# Patient Record
Sex: Female | Born: 1940 | Race: White | Hispanic: No | Smoking: Never smoker
Health system: Southern US, Community
[De-identification: ages and names within clinical notes are randomized; demographics above are authoritative.]

## PROBLEM LIST (undated history)

## (undated) DIAGNOSIS — M810 Age-related osteoporosis without current pathological fracture: Secondary | ICD-10-CM

## (undated) DIAGNOSIS — M069 Rheumatoid arthritis, unspecified: Secondary | ICD-10-CM

## (undated) DIAGNOSIS — F419 Anxiety disorder, unspecified: Secondary | ICD-10-CM

## (undated) DIAGNOSIS — E785 Hyperlipidemia, unspecified: Secondary | ICD-10-CM

## (undated) HISTORY — DX: Hyperlipidemia, unspecified: E78.5

## (undated) HISTORY — DX: Age-related osteoporosis without current pathological fracture: M81.0

## (undated) HISTORY — DX: Rheumatoid arthritis, unspecified: M06.9

## (undated) HISTORY — PX: CHOLECYSTECTOMY: SHX55

## (undated) HISTORY — DX: Anxiety disorder, unspecified: F41.9

---

## 1998-02-03 ENCOUNTER — Other Ambulatory Visit: Admission: RE | Admit: 1998-02-03 | Discharge: 1998-02-03 | Payer: Self-pay | Admitting: Family Medicine

## 2000-07-16 ENCOUNTER — Ambulatory Visit (HOSPITAL_COMMUNITY): Admission: RE | Admit: 2000-07-16 | Discharge: 2000-07-16 | Payer: Self-pay | Admitting: Family Medicine

## 2000-07-16 ENCOUNTER — Encounter: Payer: Self-pay | Admitting: Family Medicine

## 2001-02-17 ENCOUNTER — Other Ambulatory Visit: Admission: RE | Admit: 2001-02-17 | Discharge: 2001-02-17 | Payer: Self-pay | Admitting: General Surgery

## 2001-03-05 ENCOUNTER — Ambulatory Visit (HOSPITAL_COMMUNITY): Admission: RE | Admit: 2001-03-05 | Discharge: 2001-03-05 | Payer: Self-pay | Admitting: General Surgery

## 2001-07-21 ENCOUNTER — Ambulatory Visit (HOSPITAL_COMMUNITY): Admission: RE | Admit: 2001-07-21 | Discharge: 2001-07-21 | Payer: Self-pay | Admitting: General Surgery

## 2001-07-21 ENCOUNTER — Encounter: Payer: Self-pay | Admitting: General Surgery

## 2002-08-26 ENCOUNTER — Encounter: Payer: Self-pay | Admitting: General Surgery

## 2002-08-26 ENCOUNTER — Ambulatory Visit (HOSPITAL_COMMUNITY): Admission: RE | Admit: 2002-08-26 | Discharge: 2002-08-26 | Payer: Self-pay | Admitting: General Surgery

## 2003-08-30 ENCOUNTER — Ambulatory Visit (HOSPITAL_COMMUNITY): Admission: RE | Admit: 2003-08-30 | Discharge: 2003-08-30 | Payer: Self-pay | Admitting: General Surgery

## 2004-02-28 ENCOUNTER — Ambulatory Visit (HOSPITAL_COMMUNITY): Admission: RE | Admit: 2004-02-28 | Discharge: 2004-02-28 | Payer: Self-pay | Admitting: General Surgery

## 2004-09-06 ENCOUNTER — Ambulatory Visit (HOSPITAL_COMMUNITY): Admission: RE | Admit: 2004-09-06 | Discharge: 2004-09-06 | Payer: Self-pay | Admitting: General Surgery

## 2005-09-24 ENCOUNTER — Ambulatory Visit (HOSPITAL_COMMUNITY): Admission: RE | Admit: 2005-09-24 | Discharge: 2005-09-24 | Payer: Self-pay | Admitting: General Surgery

## 2005-10-05 ENCOUNTER — Encounter: Admission: RE | Admit: 2005-10-05 | Discharge: 2005-10-05 | Payer: Self-pay | Admitting: General Surgery

## 2006-06-10 ENCOUNTER — Ambulatory Visit (HOSPITAL_COMMUNITY): Admission: RE | Admit: 2006-06-10 | Discharge: 2006-06-10 | Payer: Self-pay | Admitting: Family Medicine

## 2006-10-08 ENCOUNTER — Ambulatory Visit (HOSPITAL_COMMUNITY): Admission: RE | Admit: 2006-10-08 | Discharge: 2006-10-08 | Payer: Self-pay | Admitting: Family Medicine

## 2007-12-08 ENCOUNTER — Ambulatory Visit (HOSPITAL_COMMUNITY): Admission: RE | Admit: 2007-12-08 | Discharge: 2007-12-08 | Payer: Self-pay | Admitting: Family Medicine

## 2008-12-08 ENCOUNTER — Ambulatory Visit (HOSPITAL_COMMUNITY): Admission: RE | Admit: 2008-12-08 | Discharge: 2008-12-08 | Payer: Self-pay | Admitting: Obstetrics and Gynecology

## 2009-07-21 ENCOUNTER — Ambulatory Visit: Payer: Self-pay | Admitting: Gastroenterology

## 2009-12-30 ENCOUNTER — Ambulatory Visit (HOSPITAL_COMMUNITY): Admission: RE | Admit: 2009-12-30 | Discharge: 2009-12-30 | Payer: Self-pay | Admitting: Obstetrics and Gynecology

## 2010-03-12 ENCOUNTER — Encounter: Payer: Self-pay | Admitting: Family Medicine

## 2010-07-07 NOTE — H&P (Signed)
[facility]  Patient:    Denise Webster, Denise Webster Visit Number: [id number] MRN: [id number]          Service Type: END Location: DAY Attending Physician:  Dessa Phi Dictated by:   Elpidio Anis, M.D. Admit Date:  03/05/2001                           History and Physical  HISTORY OF PRESENT ILLNESS:  A 70 year old female with history of colon cancer in her mother and father with recurrent polyps in both parents.  Patient is scheduled for screening colonoscopy.  PAST MEDICAL HISTORY:  She has gastroesophageal reflux disease, chronic anxiety disorder, and postmenopausal syndrome.  MEDICATIONS: 1. Premarin vaginal cream q.h.s. 2. Xanax 0.5 mg t.i.d. 3. Prevacid 30 mg q.d. 4. Reglan 10 mg a.c. and h.s.  PAST SURGICAL HISTORY:  Laparoscopic cholecystectomy for acute cholecystitis with cholelithiasis.  Other surgery was tubal ligation.  DRUG ALLERGIES:  None.  FAMILY HISTORY:  Positive for atherosclerotic heart disease, colon cancer in both parents, polypoid disease of the colon.  REVIEW OF SYSTEMS:  Positive for perianal itching, mild hemorrhoids, chronic anxiety with panic attacks, mild depression, gastroesophageal reflux disease. She has no known drug allergies.  PHYSICAL EXAMINATION:  GENERAL:  A pleasant female in no acute distress.  VITAL SIGNS:  Blood pressure 118/72, pulse 72, and respirations 16.  Weight 128 pounds.  HEENT:  Unremarkable.  NECK:  Supple without JVD or bruit.  CHEST:  Clear to auscultation.  No rales, rubs, rhonchi, or wheezes.  HEART:  Regular rate and rhythm without murmur, gallop, or rub.  ABDOMEN:  Soft, nontender, no masses.  RECTAL:  Normal.  Stool guaiac negative.  EXTREMITIES:  No cyanosis, clubbing, or edema.  NEUROLOGIC:  No focal motor, sensory, or cerebellar deficits.  IMPRESSION: 1. Strong family history of colon cancer. 2. Polypoid disease of the colon by history. 3. Anxiety disorder. 4.  Gastroesophageal reflux disease.  PLAN:  Total colonoscopy. Dictated by:   Elpidio Anis, M.D. Attending Physician:  Dessa Phi DD:  03/04/01 TD:  03/04/01 Job: [id number]

## 2010-11-29 ENCOUNTER — Other Ambulatory Visit: Payer: Self-pay | Admitting: Family Medicine

## 2010-11-29 DIAGNOSIS — Z139 Encounter for screening, unspecified: Secondary | ICD-10-CM

## 2011-01-05 ENCOUNTER — Ambulatory Visit (HOSPITAL_COMMUNITY): Payer: Self-pay

## 2011-01-05 ENCOUNTER — Ambulatory Visit (HOSPITAL_COMMUNITY)
Admission: RE | Admit: 2011-01-05 | Discharge: 2011-01-05 | Disposition: A | Discharge status: Home / Self Care | Payer: Medicare PPO | Source: Ambulatory Visit | Attending: Family Medicine | Admitting: Family Medicine

## 2011-01-05 DIAGNOSIS — Z139 Encounter for screening, unspecified: Secondary | ICD-10-CM

## 2011-01-05 DIAGNOSIS — Z1231 Encounter for screening mammogram for malignant neoplasm of breast: Secondary | ICD-10-CM | POA: Insufficient documentation

## 2011-11-26 ENCOUNTER — Other Ambulatory Visit: Payer: Self-pay | Admitting: Nurse Practitioner

## 2011-11-26 DIAGNOSIS — Z139 Encounter for screening, unspecified: Secondary | ICD-10-CM

## 2012-01-11 ENCOUNTER — Ambulatory Visit (HOSPITAL_COMMUNITY): Payer: Medicare PPO

## 2012-01-14 ENCOUNTER — Ambulatory Visit (HOSPITAL_COMMUNITY)
Admission: RE | Admit: 2012-01-14 | Discharge: 2012-01-14 | Disposition: A | Discharge status: Home / Self Care | Payer: Medicare PPO | Source: Ambulatory Visit | Attending: Nurse Practitioner | Admitting: Nurse Practitioner

## 2012-01-14 DIAGNOSIS — Z139 Encounter for screening, unspecified: Secondary | ICD-10-CM

## 2012-01-14 DIAGNOSIS — Z1231 Encounter for screening mammogram for malignant neoplasm of breast: Secondary | ICD-10-CM | POA: Insufficient documentation

## 2012-05-21 ENCOUNTER — Other Ambulatory Visit: Payer: Self-pay | Admitting: Nurse Practitioner

## 2012-05-21 MED ORDER — ALPRAZOLAM 0.5 MG PO TBDP: 0.5000 mg | ORAL_TABLET | Freq: Three times a day (TID) | ORAL | Status: DC | PRN

## 2012-05-21 NOTE — Telephone Encounter (Signed)
PT AWARE RX CALLED INTO [facility]

## 2012-05-21 NOTE — Telephone Encounter (Signed)
Please call into [facility] if approved, pt unable to pick up. Last filled 04-23-12

## 2012-05-21 NOTE — Telephone Encounter (Signed)
Please phone in alprazolam rx

## 2012-06-19 ENCOUNTER — Other Ambulatory Visit: Payer: Self-pay | Admitting: Nurse Practitioner

## 2012-06-20 ENCOUNTER — Telehealth: Payer: Self-pay | Admitting: Nurse Practitioner

## 2012-06-20 ENCOUNTER — Other Ambulatory Visit: Payer: Self-pay | Admitting: *Deleted

## 2012-06-20 MED ORDER — ALPRAZOLAM 0.5 MG PO TABS: 0.5000 mg | ORAL_TABLET | Freq: Three times a day (TID) | ORAL | Status: DC | PRN

## 2012-06-20 NOTE — Telephone Encounter (Signed)
Last filled 06/20/12, last seen 12/25/11. Call into [facility]

## 2012-06-24 NOTE — Telephone Encounter (Signed)
Sent to pool B to call in

## 2012-06-24 NOTE — Telephone Encounter (Signed)
Called to [facility] 

## 2012-07-20 ENCOUNTER — Other Ambulatory Visit: Payer: Self-pay | Admitting: Nurse Practitioner

## 2012-07-21 ENCOUNTER — Other Ambulatory Visit: Payer: Self-pay

## 2012-07-21 MED ORDER — ALPRAZOLAM 0.5 MG PO TABS: 0.5000 mg | ORAL_TABLET | Freq: Three times a day (TID) | ORAL | Status: DC | PRN

## 2012-07-21 NOTE — Telephone Encounter (Signed)
Last seen 12/25/11   Last filled 06/20/12  If approved call in and have nurse notify patient

## 2012-07-21 NOTE — Telephone Encounter (Signed)
Callin RX for xanax  

## 2012-07-22 ENCOUNTER — Telehealth: Payer: Self-pay | Admitting: Nurse Practitioner

## 2012-07-22 NOTE — Telephone Encounter (Signed)
rx called [facility]

## 2012-07-22 NOTE — Telephone Encounter (Signed)
Per patient rx has been fixed already and daughter has picked them up. Will call back if has further needs

## 2012-08-26 ENCOUNTER — Other Ambulatory Visit: Payer: Self-pay | Admitting: Nurse Practitioner

## 2012-08-28 ENCOUNTER — Telehealth: Payer: Self-pay | Admitting: Nurse Practitioner

## 2012-08-29 ENCOUNTER — Other Ambulatory Visit: Payer: Self-pay

## 2012-08-29 MED ORDER — ALPRAZOLAM 0.5 MG PO TABS: 0.5000 mg | ORAL_TABLET | Freq: Three times a day (TID) | ORAL | Status: DC | PRN

## 2012-08-29 NOTE — Telephone Encounter (Signed)
Please call in xanax rx with 2 refills 

## 2012-08-29 NOTE — Telephone Encounter (Signed)
Last seen 12/25/11 MMM  Last filled 07/21/12    If approved phone in and have nurse notify patient

## 2012-08-29 NOTE — Telephone Encounter (Signed)
Already routed to [facility]mm

## 2012-08-29 NOTE — Telephone Encounter (Signed)
Called to [facility].

## 2012-09-29 ENCOUNTER — Other Ambulatory Visit: Payer: Self-pay | Admitting: Nurse Practitioner

## 2012-10-01 ENCOUNTER — Ambulatory Visit: Payer: Self-pay | Admitting: Nurse Practitioner

## 2012-10-02 NOTE — Telephone Encounter (Signed)
Last seen 12/25/11

## 2012-10-29 ENCOUNTER — Other Ambulatory Visit: Payer: Self-pay | Admitting: Nurse Practitioner

## 2012-11-05 ENCOUNTER — Ambulatory Visit (INDEPENDENT_AMBULATORY_CARE_PROVIDER_SITE_OTHER): Payer: Medicare PPO | Admitting: Nurse Practitioner

## 2012-11-05 ENCOUNTER — Encounter: Payer: Self-pay | Admitting: Nurse Practitioner

## 2012-11-05 VITALS — BP 118/73 | HR 80 | Temp 97.0°F | Ht 60.0 in | Wt 117.0 lb

## 2012-11-05 DIAGNOSIS — M8580 Other specified disorders of bone density and structure, unspecified site: Secondary | ICD-10-CM

## 2012-11-05 DIAGNOSIS — Z0000 Encounter for general adult medical examination without abnormal findings: Secondary | ICD-10-CM

## 2012-11-05 DIAGNOSIS — F411 Generalized anxiety disorder: Secondary | ICD-10-CM

## 2012-11-05 DIAGNOSIS — E785 Hyperlipidemia, unspecified: Secondary | ICD-10-CM | POA: Insufficient documentation

## 2012-11-05 DIAGNOSIS — M069 Rheumatoid arthritis, unspecified: Secondary | ICD-10-CM | POA: Insufficient documentation

## 2012-11-05 DIAGNOSIS — G5790 Unspecified mononeuropathy of unspecified lower limb: Secondary | ICD-10-CM

## 2012-11-05 MED ORDER — AMITRIPTYLINE HCL 25 MG PO TABS: 25.0000 mg | ORAL_TABLET | Freq: Every day | ORAL | Status: DC

## 2012-11-05 MED ORDER — CLOBETASOL PROPIONATE 0.05 % EX CREA: TOPICAL_CREAM | Freq: Two times a day (BID) | CUTANEOUS | Status: DC

## 2012-11-05 MED ORDER — ALPRAZOLAM 0.5 MG PO TABS
0.5000 mg | ORAL_TABLET | Freq: Three times a day (TID) | ORAL | Status: DC | PRN
Start: 2012-11-05 — End: 2013-02-27

## 2012-11-05 NOTE — Progress Notes (Signed)
Subjective:    Patient ID: Denise Webster, female    DOB: 04-03-1940, 72 y.o.   MRN: [id number]  HPI Patient here today for CPE- NO PAP- She is doing well-no complaints today Patient Active Problem List   Diagnosis Date Noted  . Rheumatoid arthritis 11/05/2012  . GAD (generalized anxiety disorder) 11/05/2012  . 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 Hyperlipidemia LDL goal < 100 11/05/2012   Outpatient Encounter Prescriptions as of 11/05/2012  Medication Sig Dispense Refill  . ALPRAZolam (XANAX) 0.5 MG tablet Take 1 tablet (0.5 mg total) by mouth 3 (three) times daily as needed for sleep.  90 tablet  2  . amitriptyline (ELAVIL) 25 MG tablet TAKE ONE TABLET BY MOUTH ONE  TIME DAILY  30 tablet  0  . calcium carbonate (OS-CAL) 600 MG TABS tablet Take 600 mg by mouth 2 (two) times daily with a meal.      . co-enzyme Q-10 30 MG capsule Take 30 mg by mouth 3 (three) times daily.      Marland Kitchen etanercept (ENBREL) 50 MG/ML injection Inject 50 mg into the skin once a week.      . fish oil-omega-3 fatty acids 1000 MG capsule Take 2 g by mouth daily.      . folic acid (FOLVITE) 1 MG tablet Take 1 mg by mouth 2 (two) times daily.      Marland Kitchen glucosamine-chondroitin 500-400 MG tablet Take 1 tablet by mouth 3 (three) times daily.      . hydroxychloroquine (PLAQUENIL) 200 MG tablet Take by mouth 2 (two) times daily.      Marland Kitchen leflunomide (ARAVA) 20 MG tablet Take 20 mg by mouth daily.      . methotrexate (RHEUMATREX) 2.5 MG tablet Take 2.5 mg by mouth once a week. Caution:Chemotherapy. Protect from light.      . predniSONE (DELTASONE) 5 MG tablet Take 5 mg by mouth daily.      Marland Kitchen sulfaSALAzine (AZULFIDINE) 500 MG tablet Take 500 mg by mouth 2 (two) times daily.      . traMADol (ULTRAM) 50 MG tablet Take 50 mg by mouth 3 (three) times daily as needed for pain.      . vitamin B-12 (CYANOCOBALAMIN) 1000 MCG tablet Take 1,000 mcg by mouth daily.      . [DISCONTINUED] amitriptyline (ELAVIL) 25 MG tablet TAKE ONE TABLET BY  MOUTH ONE  TIME DAILY  30 tablet  0   No facility-administered encounter medications on file as of 11/05/2012.      Review of Systems  All other systems reviewed and are negative.       Objective:   Physical Exam  Constitutional: She is oriented to person, place, and time. She appears well-developed and well-nourished.  HENT:  Nose: Nose normal.  Mouth/Throat: Oropharynx is clear and moist.  Eyes: EOM are normal.  Neck: Trachea normal, normal range of motion and full passive range of motion without pain. Neck supple. No JVD present. Carotid bruit is not present. No thyromegaly present.  Cardiovascular: Normal rate, regular rhythm, normal heart sounds and intact distal pulses.  Exam reveals no gallop and no friction rub.   No murmur heard. Pulmonary/Chest: Effort normal and breath sounds normal.  Abdominal: Soft. Bowel sounds are normal. She exhibits no distension and no mass. There is no tenderness.  Musculoskeletal: Normal range of motion.  Lymphadenopathy:    She has no cervical adenopathy.  Neurological: She is alert and oriented to person, place, and time. She has normal  reflexes.  Skin: Skin is warm and dry.  Psychiatric: She has a normal mood and affect. Her behavior is normal. Judgment and thought content normal.   BP 118/73  Pulse 80  Temp(Src) 97 F (36.1 C) (Oral)  Ht 5' (1.524 m)  Wt 117 lb (53.071 kg)  BMI 22.85 kg/m2        Assessment & Plan:   1. Annual physical exam   2. Rheumatoid arthritis   3. GAD (generalized anxiety disorder)   4. Neuropathy of perineum, unspecified laterality   5. Hyperlipidemia LDL goal < 100   6. Osteopenia    Orders Placed This Encounter  Procedures  . CMP14+EGFR  . Anemia Profile B     Medication List       This list is accurate as of: 11/05/12 12:11 PM.  Always use your most recent med list.               ALPRAZolam 0.5 MG tablet  Commonly known as:  XANAX  Take 1 tablet (0.5 mg total) by mouth 3 (three)  times daily as needed for sleep.     amitriptyline 25 MG tablet  Commonly known as:  ELAVIL  Take 1 tablet (25 mg total) by mouth at bedtime.     calcium carbonate 600 MG Tabs tablet  Commonly known as:  OS-CAL  Take 600 mg by mouth 2 (two) times daily with a meal.     clobetasol cream 0.05 %  Commonly known as:  TEMOVATE  Apply topically 2 (two) times daily.     co-enzyme Q-10 30 MG capsule  Take 30 mg by mouth 3 (three) times daily.     etanercept 50 MG/ML injection  Commonly known as:  ENBREL  Inject 50 mg into the skin once a week.     fish oil-omega-3 fatty acids 1000 MG capsule  Take 2 g by mouth daily.     folic acid 1 MG tablet  Commonly known as:  FOLVITE  Take 1 mg by mouth 2 (two) times daily.     glucosamine-chondroitin 500-400 MG tablet  Take 1 tablet by mouth 3 (three) times daily.     hydroxychloroquine 200 MG tablet  Commonly known as:  PLAQUENIL  Take by mouth 2 (two) times daily.     leflunomide 20 MG tablet  Commonly known as:  ARAVA  Take 20 mg by mouth daily.     methotrexate 2.5 MG tablet  Commonly known as:  RHEUMATREX  Take 2.5 mg by mouth once a week. Caution:Chemotherapy. Protect from light.     predniSONE 5 MG tablet  Commonly known as:  DELTASONE  Take 5 mg by mouth daily.     sulfaSALAzine 500 MG tablet  Commonly known as:  AZULFIDINE  Take 500 mg by mouth 2 (two) times daily.     traMADol 50 MG tablet  Commonly known as:  ULTRAM  Take 50 mg by mouth 3 (three) times daily as needed for pain.     vitamin B-12 1000 MCG tablet  Commonly known as:  CYANOCOBALAMIN  Take 1,000 mcg by mouth daily.       Patient refuses to have cholesterol checked Refuses dexa scan Health maintenance reviewed Labs pending Follow up in 6 months Keep follow up appointments with rheumatologist  Mary-Margaret Daphine Deutscher, FNP

## 2012-11-05 NOTE — Patient Instructions (Signed)

## 2012-11-06 ENCOUNTER — Other Ambulatory Visit: Payer: Self-pay | Admitting: Nurse Practitioner

## 2012-11-06 LAB — ANEMIA PROFILE B
Basos: 1 %
HCT: 39.8 % (ref 34.0–46.6)
Hemoglobin: 13.4 g/dL (ref 11.1–15.9)
Immature Grans (Abs): 0 10*3/uL (ref 0.0–0.1)
Iron: 89 ug/dL (ref 35–155)
Lymphocytes Absolute: 1 10*3/uL (ref 0.7–3.1)
MCH: 35.4 pg — ABNORMAL HIGH (ref 26.6–33.0)
Monocytes: 5 %
Neutrophils Relative %: 81 %
Platelets: 316 10*3/uL (ref 150–379)
RBC: 3.78 x10E6/uL (ref 3.77–5.28)
RDW: 14.2 % (ref 12.3–15.4)
Retic Ct Pct: 1 % (ref 0.6–2.6)
TIBC: 255 ug/dL (ref 250–450)
UIBC: 166 ug/dL (ref 150–375)

## 2012-11-06 LAB — CMP14+EGFR
AST: 22 IU/L (ref 0–40)
Albumin/Globulin Ratio: 2.1 (ref 1.1–2.5)
BUN: 10 mg/dL (ref 8–27)
Calcium: 10.1 mg/dL (ref 8.6–10.2)
Creatinine, Ser: 0.79 mg/dL (ref 0.57–1.00)
GFR calc non Af Amer: 74 mL/min/{1.73_m2} (ref >59)
Potassium: 4.7 mmol/L (ref 3.5–5.2)
Total Protein: 6.8 g/dL (ref 6.0–8.5)

## 2012-12-08 ENCOUNTER — Other Ambulatory Visit: Payer: Self-pay | Admitting: Nurse Practitioner

## 2012-12-08 DIAGNOSIS — Z139 Encounter for screening, unspecified: Secondary | ICD-10-CM

## 2012-12-28 ENCOUNTER — Other Ambulatory Visit: Payer: Self-pay | Admitting: Nurse Practitioner

## 2013-01-05 ENCOUNTER — Telehealth: Payer: Self-pay | Admitting: Nurse Practitioner

## 2013-01-05 MED ORDER — ACYCLOVIR 5 % EX OINT: 1.0000 "application " | TOPICAL_OINTMENT | CUTANEOUS | Status: DC

## 2013-01-05 NOTE — Telephone Encounter (Signed)
rx sent to pharmacy

## 2013-01-07 ENCOUNTER — Ambulatory Visit (INDEPENDENT_AMBULATORY_CARE_PROVIDER_SITE_OTHER): Payer: Medicare PPO | Admitting: General Practice

## 2013-01-07 DIAGNOSIS — A6000 Herpesviral infection of urogenital system, unspecified: Secondary | ICD-10-CM

## 2013-01-07 MED ORDER — ACYCLOVIR 5 % EX OINT: TOPICAL_OINTMENT | CUTANEOUS | Status: DC

## 2013-01-07 MED ORDER — VALACYCLOVIR HCL 500 MG PO TABS: 500.0000 mg | ORAL_TABLET | Freq: Two times a day (BID) | ORAL | Status: DC

## 2013-01-07 NOTE — Patient Instructions (Signed)
Herpes Simplex Herpes simplex is generally classified as Type 1 or Type 2. Type 1 is generally the type that is responsible for cold sores. Type 2 is generally associated with sexually transmitted diseases. We now know that most of the thoughts on these viruses are inaccurate. We find that HSV1 is also present genitally and HSV2 can be present orally, but this will vary in different locations of the world. Herpes simplex is usually detected by doing a culture. Blood tests are also available for this virus; however, the accuracy is often not as good.  PREPARATION FOR TEST No preparation or fasting is necessary. NORMAL FINDINGS  No virus present  No HSV antigens or antibodies present Ranges for normal findings may vary among different laboratories and hospitals. You should always check with your doctor after having lab work or other tests done to discuss the meaning of your test results and whether your values are considered within normal limits. MEANING OF TEST  Your caregiver will go over the test results with you and discuss the importance and meaning of your results, as well as treatment options and the need for additional tests if necessary. OBTAINING THE TEST RESULTS  It is your responsibility to obtain your test results. Ask the lab or department performing the test when and how you will get your results. Document Released: 03/10/2004 Document Revised: 04/30/2011 Document Reviewed: 01/17/2008 Exit[employer] Patient Information 2014 [employer].  

## 2013-01-08 ENCOUNTER — Telehealth: Payer: Self-pay | Admitting: General Practice

## 2013-01-08 NOTE — Progress Notes (Signed)
  Subjective:    Patient ID: Denise Webster, female    DOB: 14-May-1940, 72 y.o.   MRN: [id number]  HPI Patient presents today with complaints of genital herpes outbreak. Reports onset of this outbreak was yesterday on the left labia, which followed the tingling sensation in that same area. She reports having periodic outbreaks since she was 72 years old. Reports using zovirax ointment 5% which works best for her.     Review of Systems  Constitutional: Negative for fever and chills.  Respiratory: Negative for chest tightness and shortness of breath.   Cardiovascular: Negative for chest pain and palpitations.  Genitourinary: Negative for difficulty urinating.  Skin:       Left labial blisters  Neurological: Negative for dizziness, weakness and headaches.       Objective:   Physical Exam  Constitutional: She is oriented to person, place, and time. She appears well-developed and well-nourished.  Cardiovascular: Normal rate, regular rhythm and normal heart sounds.   Pulmonary/Chest: Effort normal and breath sounds normal. No respiratory distress. She exhibits no tenderness.  Neurological: She is alert and oriented to person, place, and time.  Skin: Skin is warm and dry.  Blisters to left labia majora  Psychiatric: She has a normal mood and affect.          Assessment & Plan:  1. Genital herpes simplex - acyclovir ointment (ZOVIRAX) 5 %; Apply to affected area 3-6 times daily for 7 days  Dispense: 30 g; Refill: 3 -discussed transmission -discussed proper perineal hygiene -RTO if symptoms worsen -Patient verbalized understanding -Coralie Keens, FNP-C

## 2013-01-08 NOTE — Telephone Encounter (Signed)
I spoke with [facility] representative and now awaiting authorization for medication. Could take up to 72 hours for approval.

## 2013-01-09 ENCOUNTER — Ambulatory Visit: Payer: Medicare PPO | Admitting: General Practice

## 2013-01-09 NOTE — Telephone Encounter (Signed)
Patient aware.

## 2013-01-12 ENCOUNTER — Telehealth: Payer: Self-pay | Admitting: General Practice

## 2013-01-20 ENCOUNTER — Ambulatory Visit (HOSPITAL_COMMUNITY)
Admission: RE | Admit: 2013-01-20 | Discharge: 2013-01-20 | Disposition: A | Discharge status: Home / Self Care | Payer: Medicare PPO | Source: Ambulatory Visit | Attending: Nurse Practitioner | Admitting: Nurse Practitioner

## 2013-01-20 DIAGNOSIS — Z1231 Encounter for screening mammogram for malignant neoplasm of breast: Secondary | ICD-10-CM | POA: Insufficient documentation

## 2013-01-20 DIAGNOSIS — Z139 Encounter for screening, unspecified: Secondary | ICD-10-CM

## 2013-01-26 ENCOUNTER — Other Ambulatory Visit: Payer: Self-pay | Admitting: Nurse Practitioner

## 2013-01-30 ENCOUNTER — Other Ambulatory Visit: Payer: Self-pay

## 2013-02-25 ENCOUNTER — Other Ambulatory Visit: Payer: Self-pay | Admitting: Nurse Practitioner

## 2013-02-27 ENCOUNTER — Other Ambulatory Visit: Payer: Self-pay | Admitting: *Deleted

## 2013-02-27 MED ORDER — ALPRAZOLAM 0.5 MG PO TABS: 0.5000 mg | ORAL_TABLET | Freq: Three times a day (TID) | ORAL | Status: DC | PRN

## 2013-02-27 NOTE — Telephone Encounter (Signed)
Patient last seen in office on 01-07-13 for an acute visit. Rx last filled on 01-26-13. Please advise. If approved please route to Pool B so nurse can phone in to [facility]

## 2013-02-27 NOTE — Telephone Encounter (Signed)
Please call in xanax rx 

## 2013-03-02 NOTE — Telephone Encounter (Signed)
Called in.

## 2013-03-18 ENCOUNTER — Other Ambulatory Visit: Payer: Self-pay | Admitting: Nurse Practitioner

## 2013-03-31 ENCOUNTER — Other Ambulatory Visit: Payer: Self-pay | Admitting: Nurse Practitioner

## 2013-04-01 ENCOUNTER — Other Ambulatory Visit: Payer: Self-pay | Admitting: *Deleted

## 2013-04-01 MED ORDER — ALPRAZOLAM 0.5 MG PO TABS: 0.5000 mg | ORAL_TABLET | Freq: Three times a day (TID) | ORAL | Status: DC | PRN

## 2013-04-01 NOTE — Telephone Encounter (Signed)
Please call in xanax with 1 refills 

## 2013-04-01 NOTE — Telephone Encounter (Signed)
Last filled 03/02/13, last seen 01/07/13, Call into [facility]

## 2013-04-02 NOTE — Telephone Encounter (Signed)
[facility]

## 2013-04-28 ENCOUNTER — Other Ambulatory Visit: Payer: Self-pay | Admitting: Nurse Practitioner

## 2013-05-30 ENCOUNTER — Other Ambulatory Visit: Payer: Self-pay | Admitting: Nurse Practitioner

## 2013-06-01 NOTE — Telephone Encounter (Signed)
Left refill authorization on [facility] voicemail. 

## 2013-06-01 NOTE — Telephone Encounter (Signed)
Please call in xanax with 0 refills Patient NTBS for follow up and lab work  

## 2013-06-01 NOTE — Telephone Encounter (Signed)
Patient last seen in office on 01-07-13. Rx last filled on 04-30-13. Please advise. If approved please route to Pool B so nurse can phone in to pharmacy

## 2013-07-03 ENCOUNTER — Other Ambulatory Visit: Payer: Self-pay | Admitting: Nurse Practitioner

## 2013-07-06 NOTE — Telephone Encounter (Signed)
Please call in xanax with 0 refills Patient NTBS for follow up and lab work  

## 2013-07-06 NOTE — Telephone Encounter (Signed)
Last seen 11/05/12, last filled 06/01/13, call into [facility]

## 2013-08-08 ENCOUNTER — Other Ambulatory Visit: Payer: Self-pay | Admitting: Nurse Practitioner

## 2013-08-11 NOTE — Telephone Encounter (Signed)
Patient NTBS for follow up and lab work Please call in xanax with 1 refills  

## 2013-08-11 NOTE — Telephone Encounter (Signed)
Patient last seen in office on 01-07-13 with Denise Webster. Xanax last filled on 07-06-13 for #90. Please advise on refill. If approved please route to Pool B so nurse can phone in to pharmacy

## 2013-08-11 NOTE — Telephone Encounter (Signed)
Called in.

## 2013-08-25 ENCOUNTER — Ambulatory Visit (INDEPENDENT_AMBULATORY_CARE_PROVIDER_SITE_OTHER): Payer: Medicare PPO | Admitting: Family

## 2013-08-25 ENCOUNTER — Encounter: Payer: Self-pay | Admitting: Family

## 2013-08-25 VITALS — BP 137/79 | HR 100 | Temp 98.5°F | Ht 60.0 in | Wt 118.0 lb

## 2013-08-25 DIAGNOSIS — Z124 Encounter for screening for malignant neoplasm of cervix: Secondary | ICD-10-CM

## 2013-08-25 DIAGNOSIS — Z01419 Encounter for gynecological examination (general) (routine) without abnormal findings: Secondary | ICD-10-CM

## 2013-08-25 DIAGNOSIS — Z780 Asymptomatic menopausal state: Secondary | ICD-10-CM

## 2013-08-25 DIAGNOSIS — F411 Generalized anxiety disorder: Secondary | ICD-10-CM

## 2013-08-25 DIAGNOSIS — Z1321 Encounter for screening for nutritional disorder: Secondary | ICD-10-CM

## 2013-08-25 DIAGNOSIS — M069 Rheumatoid arthritis, unspecified: Secondary | ICD-10-CM

## 2013-08-25 LAB — POCT URINALYSIS DIPSTICK
Bilirubin, UA: NEGATIVE
Glucose, UA: NEGATIVE
KETONES UA: NEGATIVE
LEUKOCYTES UA: NEGATIVE
Nitrite, UA: NEGATIVE
PH UA: 5
PROTEIN UA: NEGATIVE
Spec Grav, UA: <=1.005
UROBILINOGEN UA: NEGATIVE

## 2013-08-25 LAB — POCT UA - MICROSCOPIC ONLY
Bacteria, U Microscopic: NEGATIVE
CASTS, UR, LPF, POC: NEGATIVE
Crystals, Ur, HPF, POC: NEGATIVE
MUCUS UA: NEGATIVE
WBC, UR, HPF, POC: NEGATIVE
YEAST UA: NEGATIVE

## 2013-08-25 MED ORDER — AMITRIPTYLINE HCL 25 MG PO TABS: ORAL_TABLET | ORAL | Status: DC

## 2013-08-25 MED ORDER — ALPRAZOLAM 0.5 MG PO TABS: ORAL_TABLET | ORAL | Status: DC

## 2013-08-25 NOTE — Progress Notes (Signed)
   Subjective:    Patient ID: Denise Webster, female    DOB: February 20, 1940, 73 y.o.   MRN: [id number]  HPI Pt presents to the office for annual physical with pap. Pt has rheumastoid arthritis and anxiety. Pt currently seeing a rheumatoid specialists in [place]. Pt currently taking xanax 0.5 mg TID prn and amitriptyline at night to help her sleep. PT denies any SOB, edema, or palpations.     Review of Systems  Constitutional: Negative.   HENT: Negative.   Eyes: Negative.   Respiratory: Negative.  Negative for shortness of breath.   Cardiovascular: Negative.  Negative for palpitations.  Gastrointestinal: Negative.   Endocrine: Negative.   Genitourinary: Negative.   Musculoskeletal: Positive for arthralgias and joint swelling.  Neurological: Negative.   Hematological: Negative.   Psychiatric/Behavioral: Negative.   All other systems reviewed and are negative.      Objective:   Physical Exam  Vitals reviewed. Constitutional: She is oriented to person, place, and time. She appears well-developed and well-nourished. No distress.  HENT:  Head: Normocephalic and atraumatic.  Right Ear: External ear normal.  Mouth/Throat: Oropharynx is clear and moist.  Eyes: Pupils are equal, round, and reactive to light.  Neck: Normal range of motion. Neck supple. No thyromegaly present.  Cardiovascular: Normal rate, regular rhythm, normal heart sounds and intact distal pulses.   No murmur heard. Pulmonary/Chest: Effort normal and breath sounds normal. No respiratory distress. She has no wheezes. Right breast exhibits no mass, no nipple discharge, no skin change and no tenderness. Left breast exhibits no mass, no nipple discharge, no skin change and no tenderness. Breasts are symmetrical.  Abdominal: Soft. Bowel sounds are normal. She exhibits no distension. There is no tenderness.  Genitourinary:  Labia erythemas and inflamed Bimanual-Ovaries nonpalpable, no masses or tenderness Cervix parous and  pink    Musculoskeletal: Normal range of motion. She exhibits no edema and no tenderness.  Neurological: She is alert and oriented to person, place, and time. She has normal reflexes. No cranial nerve deficit.  Skin: Skin is warm and dry.  Psychiatric: She has a normal mood and affect. Her behavior is normal. Judgment and thought content normal.    BP 137/79  Pulse 100  Temp(Src) 98.5 F (36.9 C) (Oral)  Ht 5' (1.524 m)  Wt 118 lb (53.524 kg)  BMI 23.05 kg/m2       Assessment & Plan:  1. GAD (generalized anxiety disorder) - ALPRAZolam (XANAX) 0.5 MG tablet; TAKE ONE TABLET BY MOUTH THREE TIMES DAILY AS NEEDED  Dispense: 90 tablet; Refill: 1 - amitriptyline (ELAVIL) 25 MG tablet; TAKE ONE TABLET BY MOUTH AT BEDTIME  Dispense: 30 tablet; Refill: 1  2. Rheumatoid arthritis  3. Encounter for vitamin deficiency screening  4. Post-menopausal - DG Bone Density; Future  5. Encounter for routine gynecological examination - POCT urinalysis dipstick - POCT UA - Microscopic Only - Pap IG (Image Guided)   Continue all meds Labs pending Health Maintenance reviewed Handicap decal paperwork filled out Diet and exercise encouraged RTO 1 year  Jannifer Rodney, FNP

## 2013-08-25 NOTE — Patient Instructions (Signed)

## 2013-08-27 LAB — PAP IG (IMAGE GUIDED): PAP Smear Comment: 0

## 2013-09-18 ENCOUNTER — Telehealth: Payer: Self-pay | Admitting: Family

## 2013-09-18 ENCOUNTER — Encounter: Payer: Self-pay | Admitting: Family Medicine

## 2013-09-18 NOTE — Telephone Encounter (Signed)
Pap with insufficient cells. How do you want to proceed?

## 2013-09-18 NOTE — Telephone Encounter (Signed)
Left Message on Medical Records voicemail- said she had a Pap done about a month ago and no one has called her with the results.

## 2013-09-19 NOTE — Telephone Encounter (Signed)
I spoke with this patient yesterday regarding her results. There were insufficient cells to evaluate and I am following up with her provider on the plan of care.

## 2013-09-22 NOTE — Telephone Encounter (Signed)
Patient aware.

## 2013-09-22 NOTE — Telephone Encounter (Signed)
Pap inconclusive. Pt can wait and have another one next year or reschedule to  Have another one. Guidelines recommend that a pt cn discontinue cervical cancer screening if you have had two negatives results in row or three negative pap tests results in a row within the previous 10 years with no history of moderate dysplasia or higher. p

## 2013-10-11 ENCOUNTER — Other Ambulatory Visit: Payer: Self-pay | Admitting: Nurse Practitioner

## 2013-11-13 ENCOUNTER — Other Ambulatory Visit: Payer: Self-pay | Admitting: Family Medicine

## 2013-11-16 ENCOUNTER — Other Ambulatory Visit: Payer: Self-pay | Admitting: *Deleted

## 2013-11-16 MED ORDER — HYDROCORTISONE 2.5 % RE CREA
TOPICAL_CREAM | RECTAL | Status: DC
Start: 2013-11-16 — End: 2014-03-10

## 2013-11-16 NOTE — Telephone Encounter (Signed)
Aware,script for xanax ready. Please come by office.

## 2013-11-16 NOTE — Telephone Encounter (Signed)
Last filled 10/11/13, last seen 08/25/13. Route to pool A if approved. Nurse call into [facility]

## 2013-11-16 NOTE — Telephone Encounter (Signed)
Called in.

## 2013-11-25 ENCOUNTER — Other Ambulatory Visit: Payer: Medicare PPO

## 2013-11-25 ENCOUNTER — Ambulatory Visit: Payer: Medicare PPO

## 2013-12-14 ENCOUNTER — Other Ambulatory Visit: Payer: Self-pay | Admitting: Family

## 2013-12-15 ENCOUNTER — Other Ambulatory Visit: Payer: Self-pay | Admitting: Nurse Practitioner

## 2013-12-15 DIAGNOSIS — Z1231 Encounter for screening mammogram for malignant neoplasm of breast: Secondary | ICD-10-CM

## 2013-12-15 NOTE — Telephone Encounter (Signed)
Called in.

## 2013-12-15 NOTE — Telephone Encounter (Signed)
Last seen 08/25/13 Christy  If approved route to nurse to call into [facility] 

## 2013-12-15 NOTE — Telephone Encounter (Signed)
Please call in ativan with 1 refills 

## 2013-12-15 NOTE — Telephone Encounter (Signed)
Oops shold be Please call in xanax with 1 refills

## 2014-01-15 ENCOUNTER — Other Ambulatory Visit: Payer: Self-pay | Admitting: Nurse Practitioner

## 2014-01-22 ENCOUNTER — Ambulatory Visit (HOSPITAL_COMMUNITY): Payer: Medicare PPO

## 2014-01-29 ENCOUNTER — Ambulatory Visit (HOSPITAL_COMMUNITY): Payer: Medicare PPO

## 2014-02-03 ENCOUNTER — Ambulatory Visit (HOSPITAL_COMMUNITY): Payer: Medicare PPO

## 2014-02-11 ENCOUNTER — Other Ambulatory Visit: Payer: Self-pay | Admitting: Nurse Practitioner

## 2014-02-11 NOTE — Telephone Encounter (Signed)
Phoned in.

## 2014-02-11 NOTE — Telephone Encounter (Signed)
Please call in xanax with 0 refills 

## 2014-02-11 NOTE — Telephone Encounter (Signed)
Last seen 08/25/13 Denise Webster  If approved route to nurse to call into [facility]

## 2014-02-17 ENCOUNTER — Ambulatory Visit (HOSPITAL_COMMUNITY)
Admission: RE | Admit: 2014-02-17 | Discharge: 2014-02-17 | Disposition: A | Discharge status: Home / Self Care | Payer: Medicare PPO | Source: Ambulatory Visit | Attending: Nurse Practitioner | Admitting: Nurse Practitioner

## 2014-02-17 DIAGNOSIS — Z1231 Encounter for screening mammogram for malignant neoplasm of breast: Secondary | ICD-10-CM | POA: Insufficient documentation

## 2014-03-10 ENCOUNTER — Other Ambulatory Visit: Payer: Self-pay | Admitting: Nurse Practitioner

## 2014-03-11 NOTE — Telephone Encounter (Signed)
Last seen 08/25/13 Denise Webster  No upcoming appt. scheduled

## 2014-03-13 NOTE — Telephone Encounter (Signed)
Called to pharmacy 

## 2014-04-12 ENCOUNTER — Other Ambulatory Visit: Payer: Self-pay | Admitting: Family

## 2014-04-13 NOTE — Telephone Encounter (Signed)
Last seen 08/25/13 Denise Webster  If approved route to nurse to call into [facility] 

## 2014-04-14 NOTE — Telephone Encounter (Signed)
One refill called to pharmacy

## 2014-04-14 NOTE — Telephone Encounter (Signed)
Prescription sent to pharmacy and ready for pickup

## 2014-05-12 ENCOUNTER — Other Ambulatory Visit: Payer: Self-pay | Admitting: Family

## 2014-05-12 NOTE — Telephone Encounter (Signed)
Last seen 08/25/13. Last filled 04/14/14. Route to pool. Uses [facility]

## 2014-05-13 NOTE — Telephone Encounter (Signed)
rx called into pharmacy

## 2014-06-12 ENCOUNTER — Other Ambulatory Visit: Payer: Self-pay | Admitting: Family

## 2014-06-14 NOTE — Telephone Encounter (Signed)
Please advise on refill, patient last seen 08/25/13.

## 2014-06-14 NOTE — Telephone Encounter (Signed)
Refill left on [facility]

## 2014-08-13 ENCOUNTER — Other Ambulatory Visit: Payer: Self-pay | Admitting: Family

## 2014-08-17 ENCOUNTER — Ambulatory Visit: Payer: Medicare PPO | Admitting: *Deleted

## 2014-08-19 ENCOUNTER — Ambulatory Visit (INDEPENDENT_AMBULATORY_CARE_PROVIDER_SITE_OTHER): Payer: PPO | Admitting: Family

## 2014-08-19 ENCOUNTER — Encounter: Payer: Self-pay | Admitting: Family

## 2014-08-19 VITALS — BP 141/87 | HR 92 | Temp 98.0°F | Ht 60.0 in | Wt 115.0 lb

## 2014-08-19 DIAGNOSIS — M069 Rheumatoid arthritis, unspecified: Secondary | ICD-10-CM | POA: Diagnosis not present

## 2014-08-19 DIAGNOSIS — F411 Generalized anxiety disorder: Secondary | ICD-10-CM

## 2014-08-19 DIAGNOSIS — G4700 Insomnia, unspecified: Secondary | ICD-10-CM

## 2014-08-19 DIAGNOSIS — E785 Hyperlipidemia, unspecified: Secondary | ICD-10-CM

## 2014-08-19 MED ORDER — ALPRAZOLAM 0.5 MG PO TABS: 0.5000 mg | ORAL_TABLET | Freq: Three times a day (TID) | ORAL | Status: DC | PRN

## 2014-08-19 MED ORDER — AMITRIPTYLINE HCL 25 MG PO TABS: ORAL_TABLET | ORAL | Status: DC

## 2014-08-19 NOTE — Patient Instructions (Signed)

## 2014-08-19 NOTE — Progress Notes (Signed)
   Subjective:    Patient ID: Denise Webster, female    DOB: Jun 23, 1940, 74 y.o.   MRN: [id number]  Pt presents to the office today for chronic follow-up. Pt has Rheumatoid arthritis who she is  See's a rheumatologists every three months.  Hyperlipidemia Pertinent negatives include no shortness of breath.  Anxiety Presents for follow-up visit. Onset was 1 to 6 months ago. The problem has been unchanged. Symptoms include excessive worry, insomnia and nervous/anxious behavior. Patient reports no palpitations, panic or shortness of breath. Symptoms occur occasionally. The symptoms are aggravated by family issues.   Past treatments include benzodiazephines. The treatment provided moderate relief. Compliance with prior treatments has been good.      Review of Systems  Constitutional: Negative.   HENT: Negative.   Eyes: Negative.   Respiratory: Negative.  Negative for shortness of breath.   Cardiovascular: Negative.  Negative for palpitations.  Gastrointestinal: Negative.   Endocrine: Negative.   Genitourinary: Negative.   Musculoskeletal: Negative.   Neurological: Negative.  Negative for headaches.  Hematological: Negative.   Psychiatric/Behavioral: The patient is nervous/anxious and has insomnia.   All other systems reviewed and are negative.      Objective:   Physical Exam  Constitutional: She is oriented to person, place, and time. She appears well-developed and well-nourished. No distress.  HENT:  Head: Normocephalic and atraumatic.  Right Ear: External ear normal.  Mouth/Throat: Oropharynx is clear and moist.  Eyes: Pupils are equal, round, and reactive to light.  Neck: Normal range of motion. Neck supple. No thyromegaly present.  Cardiovascular: Normal rate, regular rhythm, normal heart sounds and intact distal pulses.   No murmur heard. Pulmonary/Chest: Effort normal and breath sounds normal. No respiratory distress. She has no wheezes.  Abdominal: Soft. Bowel sounds are  normal. She exhibits no distension. There is no tenderness.  Musculoskeletal: Normal range of motion. She exhibits no edema or tenderness.  Neurological: She is alert and oriented to person, place, and time. She has normal reflexes. No cranial nerve deficit.  Skin: Skin is warm and dry.  Psychiatric: She has a normal mood and affect. Her behavior is normal. Judgment and thought content normal.  Vitals reviewed.     BP 141/87 mmHg  Pulse 92  Temp(Src) 98 F (36.7 C) (Oral)  Ht 5' (1.524 m)  Wt 115 lb (52.164 kg)  BMI 22.46 kg/m2     Assessment & Plan:  1. Rheumatoid arthritis - CMP14+EGFR  2. GAD (generalized anxiety disorder) - ALPRAZolam (XANAX) 0.5 MG tablet; Take 1 tablet (0.5 mg total) by mouth 3 (three) times daily as needed.  Dispense: 90 tablet; Refill: 4 - amitriptyline (ELAVIL) 25 MG tablet; TAKE ONE TABLET BY MOUTH AT BEDTIME  Dispense: 90 tablet; Refill: 3 - CMP14+EGFR  3. Hyperlipidemia with target LDL less than 100 - CMP14+EGFR  4. Insomnia - amitriptyline (ELAVIL) 25 MG tablet; TAKE ONE TABLET BY MOUTH AT BEDTIME  Dispense: 90 tablet; Refill: 3 - CMP14+EGFR   Continue all meds Labs pending Health Maintenance reviewed Diet and exercise encouraged RTO 6 month  Evelina Dun, FNP

## 2014-08-20 LAB — CMP14+EGFR
ALT: 18 IU/L (ref 0–32)
AST: 22 IU/L (ref 0–40)
Albumin/Globulin Ratio: 2 (ref 1.1–2.5)
Albumin: 4.3 g/dL (ref 3.5–4.8)
Alkaline Phosphatase: 31 IU/L — ABNORMAL LOW (ref 39–117)
BUN / CREAT RATIO: 12 (ref 11–26)
BUN: 9 mg/dL (ref 8–27)
Bilirubin Total: 0.4 mg/dL (ref 0.0–1.2)
CHLORIDE: 97 mmol/L (ref 97–108)
CO2: 25 mmol/L (ref 18–29)
Calcium: 9.7 mg/dL (ref 8.7–10.3)
Creatinine, Ser: 0.73 mg/dL (ref 0.57–1.00)
GFR calc Af Amer: 93 mL/min/{1.73_m2} (ref >59)
GFR calc non Af Amer: 81 mL/min/{1.73_m2} (ref >59)
GLUCOSE: 96 mg/dL (ref 65–99)
Globulin, Total: 2.1 g/dL (ref 1.5–4.5)
Potassium: 4.2 mmol/L (ref 3.5–5.2)
Sodium: 138 mmol/L (ref 134–144)
Total Protein: 6.4 g/dL (ref 6.0–8.5)

## 2014-08-24 ENCOUNTER — Ambulatory Visit: Payer: Medicare PPO | Admitting: *Deleted

## 2014-11-30 ENCOUNTER — Ambulatory Visit (INDEPENDENT_AMBULATORY_CARE_PROVIDER_SITE_OTHER): Payer: PPO

## 2014-11-30 ENCOUNTER — Encounter: Payer: Self-pay | Admitting: Family

## 2014-11-30 ENCOUNTER — Ambulatory Visit (INDEPENDENT_AMBULATORY_CARE_PROVIDER_SITE_OTHER): Payer: PPO | Admitting: Family

## 2014-11-30 VITALS — BP 130/86 | HR 105 | Temp 96.7°F | Ht 60.0 in | Wt 115.6 lb

## 2014-11-30 DIAGNOSIS — F411 Generalized anxiety disorder: Secondary | ICD-10-CM | POA: Diagnosis not present

## 2014-11-30 DIAGNOSIS — G5790 Unspecified mononeuropathy of unspecified lower limb: Secondary | ICD-10-CM | POA: Diagnosis not present

## 2014-11-30 DIAGNOSIS — R5383 Other fatigue: Secondary | ICD-10-CM

## 2014-11-30 DIAGNOSIS — M810 Age-related osteoporosis without current pathological fracture: Secondary | ICD-10-CM

## 2014-11-30 DIAGNOSIS — G4700 Insomnia, unspecified: Secondary | ICD-10-CM | POA: Diagnosis not present

## 2014-11-30 DIAGNOSIS — E785 Hyperlipidemia, unspecified: Secondary | ICD-10-CM

## 2014-11-30 DIAGNOSIS — M0579 Rheumatoid arthritis with rheumatoid factor of multiple sites without organ or systems involvement: Secondary | ICD-10-CM

## 2014-11-30 DIAGNOSIS — Z780 Asymptomatic menopausal state: Secondary | ICD-10-CM

## 2014-11-30 DIAGNOSIS — G588 Other specified mononeuropathies: Secondary | ICD-10-CM

## 2014-11-30 MED ORDER — ALPRAZOLAM 0.5 MG PO TABS: 0.5000 mg | ORAL_TABLET | Freq: Three times a day (TID) | ORAL | Status: DC | PRN

## 2014-11-30 MED ORDER — AMITRIPTYLINE HCL 25 MG PO TABS: ORAL_TABLET | ORAL | Status: DC

## 2014-11-30 MED ORDER — ALENDRONATE SODIUM 70 MG PO TABS: 70.0000 mg | ORAL_TABLET | ORAL | Status: DC

## 2014-11-30 MED ORDER — HYDROCORTISONE 2.5 % RE CREA: TOPICAL_CREAM | RECTAL | Status: AC

## 2014-11-30 NOTE — Addendum Note (Signed)
Addended by: Jannifer Rodney A on: 11/30/2014 11:11 AM   Modules accepted: Orders

## 2014-11-30 NOTE — Progress Notes (Signed)
Subjective:    Patient ID: Denise Webster, female    DOB: 1940/05/14, 74 y.o.   MRN: [id number]  Pt presents to the office today for chronic follow up. Pt has Rheumatoid arthritis who she is  See's a rheumatologists every three months.  Hyperlipidemia This is a chronic problem. The current episode started more than 1 year ago. The problem is uncontrolled. Recent lipid tests were reviewed and are high. She has no history of diabetes. Pertinent negatives include no leg pain or shortness of breath. Current antihyperlipidemic treatment includes diet change. The current treatment provides mild improvement of lipids. Risk factors for coronary artery disease include dyslipidemia, hypertension, post-menopausal and a sedentary lifestyle.  Anxiety Presents for follow-up visit. Onset was 1 to 6 months ago. The problem has been unchanged. Symptoms include excessive worry, insomnia and nervous/anxious behavior. Patient reports no depressed mood, palpitations, panic or shortness of breath. Symptoms occur occasionally. The symptoms are aggravated by family issues.   Her past medical history is significant for anxiety/panic attacks. Past treatments include benzodiazephines and non-benzodiazephine anxiolytics. The treatment provided moderate relief. Compliance with prior treatments has been good.      Review of Systems  Constitutional: Negative.   HENT: Negative.   Eyes: Negative.   Respiratory: Negative.  Negative for shortness of breath.   Cardiovascular: Negative.  Negative for palpitations.  Gastrointestinal: Negative.   Endocrine: Negative.   Genitourinary: Negative.   Musculoskeletal: Negative.   Neurological: Negative.  Negative for headaches.  Hematological: Negative.   Psychiatric/Behavioral: The patient is nervous/anxious and has insomnia.   All other systems reviewed and are negative.      Objective:   Physical Exam  Constitutional: She is oriented to person, place, and time. She  appears well-developed and well-nourished. No distress.  HENT:  Head: Normocephalic and atraumatic.  Right Ear: External ear normal.  Left Ear: External ear normal.  Nose: Nose normal.  Mouth/Throat: Oropharynx is clear and moist.  Eyes: Pupils are equal, round, and reactive to light.  Neck: Normal range of motion. Neck supple. No thyromegaly present.  Cardiovascular: Normal rate, regular rhythm, normal heart sounds and intact distal pulses.   No murmur heard. Pulmonary/Chest: Effort normal and breath sounds normal. No respiratory distress. She has no wheezes.  Abdominal: Soft. Bowel sounds are normal. She exhibits no distension. There is no tenderness.  Musculoskeletal: Normal range of motion. She exhibits no edema or tenderness.  Neurological: She is alert and oriented to person, place, and time. She has normal reflexes. No cranial nerve deficit.  Skin: Skin is warm and dry.  Psychiatric: She has a normal mood and affect. Her behavior is normal. Judgment and thought content normal.  Vitals reviewed.   BP 130/86 mmHg  Pulse 105  Temp(Src) 96.7 F (35.9 C) (Oral)  Ht 5' (1.524 m)  Wt 115 lb 9.6 oz (52.436 kg)  BMI 22.58 kg/m2       Assessment & Plan:  1. Rheumatoid arthritis involving multiple sites with positive rheumatoid factor (HCC) - CMP14+EGFR  2. GAD (generalized anxiety disorder) - CMP14+EGFR - ALPRAZolam (XANAX) 0.5 MG tablet; Take 1 tablet (0.5 mg total) by mouth 3 (three) times daily as needed.  Dispense: 90 tablet; Refill: 5 - amitriptyline (ELAVIL) 25 MG tablet; TAKE ONE TABLET BY MOUTH AT BEDTIME  Dispense: 90 tablet; Refill: 3  3. Hyperlipidemia with target LDL less than 100 - CMP14+EGFR - Lipid panel  4. Insomnia - CMP14+EGFR - amitriptyline (ELAVIL) 25 MG tablet; TAKE ONE TABLET  BY MOUTH AT BEDTIME  Dispense: 90 tablet; Refill: 3  5. Neuropathy of perineum, unspecified laterality - CMP14+EGFR - hydrocortisone (PROCTOZONE-HC) 2.5 % rectal cream; APPLY  TO AFFECTED AREA TWICE A DAY TO FOUR TIMES A DAY AS INSTRUCTED  Dispense: 30 g; Refill: 11  6. Post-menopause - DG Bone Density; Future - CMP14+EGFR  7. Other fatigue - CMP14+EGFR - Thyroid Panel With TSH - Vit D  25 hydroxy (rtn osteoporosis monitoring)   Continue all meds Labs pending Health Maintenance reviewed Diet and exercise encouraged RTO 6 months  Evelina Dun, FNP

## 2014-11-30 NOTE — Addendum Note (Signed)
Addended by: Prescott Gum on: 11/30/2014 11:39 AM   Modules accepted: Kipp Brood

## 2014-11-30 NOTE — Patient Instructions (Signed)

## 2014-12-01 LAB — CMP14+EGFR
A/G RATIO: 1.9 (ref 1.1–2.5)
ALK PHOS: 35 IU/L — AB (ref 39–117)
ALT: 30 IU/L (ref 0–32)
AST: 31 IU/L (ref 0–40)
Albumin: 4.2 g/dL (ref 3.5–4.8)
BUN/Creatinine Ratio: 16 (ref 11–26)
BUN: 13 mg/dL (ref 8–27)
Bilirubin Total: 0.6 mg/dL (ref 0.0–1.2)
CO2: 23 mmol/L (ref 18–29)
Calcium: 9.3 mg/dL (ref 8.7–10.3)
Chloride: 96 mmol/L — ABNORMAL LOW (ref 97–108)
Creatinine, Ser: 0.79 mg/dL (ref 0.57–1.00)
GFR calc non Af Amer: 73 mL/min/{1.73_m2} (ref >59)
GFR, EST AFRICAN AMERICAN: 84 mL/min/{1.73_m2} (ref >59)
Globulin, Total: 2.2 g/dL (ref 1.5–4.5)
Glucose: 83 mg/dL (ref 65–99)
POTASSIUM: 4.2 mmol/L (ref 3.5–5.2)
Sodium: 137 mmol/L (ref 134–144)
TOTAL PROTEIN: 6.4 g/dL (ref 6.0–8.5)

## 2014-12-01 LAB — VITAMIN D 25 HYDROXY (VIT D DEFICIENCY, FRACTURES): VIT D 25 HYDROXY: 92 ng/mL (ref 30.0–100.0)

## 2014-12-01 LAB — THYROID PANEL WITH TSH
FREE THYROXINE INDEX: 2.2 (ref 1.2–4.9)
T3 Uptake Ratio: 27 % (ref 24–39)
T4, Total: 8.3 ug/dL (ref 4.5–12.0)
TSH: 1.45 u[IU]/mL (ref 0.450–4.500)

## 2014-12-01 LAB — LIPID PANEL
CHOLESTEROL TOTAL: 192 mg/dL (ref 100–199)
Chol/HDL Ratio: 2 ratio units (ref 0.0–4.4)
HDL: 94 mg/dL (ref >39)
LDL Calculated: 83 mg/dL (ref 0–99)
TRIGLYCERIDES: 76 mg/dL (ref 0–149)
VLDL CHOLESTEROL CAL: 15 mg/dL (ref 5–40)

## 2015-01-03 LAB — COLOGUARD

## 2015-01-10 ENCOUNTER — Other Ambulatory Visit: Payer: Self-pay | Admitting: Family

## 2015-01-10 DIAGNOSIS — Z1231 Encounter for screening mammogram for malignant neoplasm of breast: Secondary | ICD-10-CM

## 2015-02-25 ENCOUNTER — Ambulatory Visit (HOSPITAL_COMMUNITY)
Admission: RE | Admit: 2015-02-25 | Discharge: 2015-02-25 | Disposition: A | Discharge status: Home / Self Care | Payer: PPO | Source: Ambulatory Visit | Attending: Family | Admitting: Family

## 2015-02-25 DIAGNOSIS — Z1231 Encounter for screening mammogram for malignant neoplasm of breast: Secondary | ICD-10-CM | POA: Insufficient documentation

## 2015-03-15 DIAGNOSIS — R768 Other specified abnormal immunological findings in serum: Secondary | ICD-10-CM | POA: Diagnosis not present

## 2015-03-15 DIAGNOSIS — J069 Acute upper respiratory infection, unspecified: Secondary | ICD-10-CM | POA: Diagnosis not present

## 2015-03-15 DIAGNOSIS — Z79899 Other long term (current) drug therapy: Secondary | ICD-10-CM | POA: Diagnosis not present

## 2015-03-15 DIAGNOSIS — M2550 Pain in unspecified joint: Secondary | ICD-10-CM | POA: Diagnosis not present

## 2015-03-15 DIAGNOSIS — M0579 Rheumatoid arthritis with rheumatoid factor of multiple sites without organ or systems involvement: Secondary | ICD-10-CM | POA: Diagnosis not present

## 2015-06-07 ENCOUNTER — Ambulatory Visit (INDEPENDENT_AMBULATORY_CARE_PROVIDER_SITE_OTHER): Payer: PPO | Admitting: Family

## 2015-06-07 ENCOUNTER — Encounter: Payer: Self-pay | Admitting: Family

## 2015-06-07 VITALS — BP 125/76 | HR 90 | Temp 97.2°F | Ht 60.0 in | Wt 118.0 lb

## 2015-06-07 DIAGNOSIS — G4700 Insomnia, unspecified: Secondary | ICD-10-CM

## 2015-06-07 DIAGNOSIS — E785 Hyperlipidemia, unspecified: Secondary | ICD-10-CM | POA: Diagnosis not present

## 2015-06-07 DIAGNOSIS — F411 Generalized anxiety disorder: Secondary | ICD-10-CM

## 2015-06-07 DIAGNOSIS — M810 Age-related osteoporosis without current pathological fracture: Secondary | ICD-10-CM | POA: Diagnosis not present

## 2015-06-07 DIAGNOSIS — Z23 Encounter for immunization: Secondary | ICD-10-CM

## 2015-06-07 DIAGNOSIS — M0579 Rheumatoid arthritis with rheumatoid factor of multiple sites without organ or systems involvement: Secondary | ICD-10-CM | POA: Diagnosis not present

## 2015-06-07 MED ORDER — AMITRIPTYLINE HCL 25 MG PO TABS: ORAL_TABLET | ORAL | Status: DC

## 2015-06-07 MED ORDER — ALPRAZOLAM 0.5 MG PO TABS: 0.5000 mg | ORAL_TABLET | Freq: Three times a day (TID) | ORAL | Status: DC | PRN

## 2015-06-07 NOTE — Addendum Note (Signed)
Addended by: Almeta Monas on: 06/07/2015 02:53 PM   Modules accepted: Orders

## 2015-06-07 NOTE — Progress Notes (Addendum)
Subjective:    Patient ID: Denise Webster, female    DOB: 02-05-1941, 75 y.o.   MRN: [id number]  Pt presents to the office today for chronic follow up. Pt has Rheumatoid arthritis who she see's a rheumatologists every three months.  Arthritis Presents for follow-up visit. The disease course has been worsening. She complains of pain and joint swelling. Affected locations include the right MCP, left MCP, right knee and left knee. Her pain is at a severity of 6/10. Associated symptoms include pain while resting. Pertinent negatives include no pain at night. Her past medical history is significant for osteoarthritis and rheumatoid arthritis. Past treatments include heat, activity, an opioid and rest.  Hyperlipidemia This is a chronic problem. The current episode started more than 1 year ago. The problem is controlled. Recent lipid tests were reviewed and are normal. She has no history of diabetes. Pertinent negatives include no leg pain or shortness of breath. Current antihyperlipidemic treatment includes diet change. The current treatment provides mild improvement of lipids. Risk factors for coronary artery disease include dyslipidemia, hypertension, post-menopausal and a sedentary lifestyle.  Anxiety Presents for follow-up visit. Onset was 1 to 6 months ago. The problem has been unchanged. Symptoms include excessive worry, insomnia and nervous/anxious behavior. Patient reports no depressed mood, palpitations, panic or shortness of breath. Symptoms occur occasionally. The symptoms are aggravated by family issues.   Her past medical history is significant for anxiety/panic attacks. Past treatments include benzodiazephines and non-benzodiazephine anxiolytics. The treatment provided moderate relief. Compliance with prior treatments has been good.  Insomnia Primary symptoms: difficulty falling asleep.  The current episode started more than one year. The onset quality is gradual. The problem occurs rarely.  The problem has been waxing and waning since onset. Past treatments include medication. The treatment provided moderate relief.  Osteoporosis PT states she stopped her Fosamax because it was causing her to "hurt". PT states she tries to do weight bearing exercises.     Review of Systems  Constitutional: Negative.   HENT: Negative.   Eyes: Negative.   Respiratory: Negative.  Negative for shortness of breath.   Cardiovascular: Negative.  Negative for palpitations.  Gastrointestinal: Negative.   Endocrine: Negative.   Genitourinary: Negative.   Musculoskeletal: Positive for joint swelling and arthritis.  Neurological: Negative.  Negative for headaches.  Hematological: Negative.   Psychiatric/Behavioral: The patient is nervous/anxious and has insomnia.   All other systems reviewed and are negative.      Objective:   Physical Exam  Constitutional: She is oriented to person, place, and time. She appears well-developed and well-nourished. No distress.  HENT:  Head: Normocephalic and atraumatic.  Right Ear: External ear normal.  Left Ear: External ear normal.  Nose: Nose normal.  Mouth/Throat: Oropharynx is clear and moist.  Eyes: Pupils are equal, round, and reactive to light.  Neck: Normal range of motion. Neck supple. No thyromegaly present.  Cardiovascular: Normal rate, regular rhythm, normal heart sounds and intact distal pulses.   No murmur heard. Pulmonary/Chest: Effort normal and breath sounds normal. No respiratory distress. She has no wheezes.  Abdominal: Soft. Bowel sounds are normal. She exhibits no distension. There is no tenderness.  Musculoskeletal: Normal range of motion. She exhibits no edema or tenderness.  Neurological: She is alert and oriented to person, place, and time. She has normal reflexes. No cranial nerve deficit.  Skin: Skin is warm and dry.  Psychiatric: She has a normal mood and affect. Her behavior is normal. Judgment and thought  content normal.    Vitals reviewed.   BP 125/76 mmHg  Pulse 90  Temp(Src) 97.2 F (36.2 C) (Oral)  Ht 5' (1.524 m)  Wt 118 lb (53.524 kg)  BMI 23.05 kg/m2       Assessment & Plan:  1. Insomnia - CMP14+EGFR - amitriptyline (ELAVIL) 25 MG tablet; TAKE ONE TABLET BY MOUTH AT BEDTIME  Dispense: 90 tablet; Refill: 3  2. Hyperlipidemia with target LDL less than 100 - CMP14+EGFR - Lipid panel  3. GAD (generalized anxiety disorder) - CMP14+EGFR - amitriptyline (ELAVIL) 25 MG tablet; TAKE ONE TABLET BY MOUTH AT BEDTIME  Dispense: 90 tablet; Refill: 3 - ALPRAZolam (XANAX) 0.5 MG tablet; Take 1 tablet (0.5 mg total) by mouth 3 (three) times daily as needed.  Dispense: 90 tablet; Refill: 5  4. Osteoporosis - CMP14+EGFR  5. Rheumatoid arthritis involving multiple sites with positive rheumatoid factor ([place]) - CMP14+EGFR   Continue all meds Labs pending Health Maintenance reviewed Diet and exercise encouraged RTO 6 months  Evelina Dun, FNP

## 2015-06-07 NOTE — Patient Instructions (Signed)
Health Maintenance, Female Adopting a healthy lifestyle and getting preventive care can go a long way to promote health and wellness. Talk with your health care provider about what schedule of regular examinations is right for you. This is a good chance for you to check in with your provider about disease prevention and staying healthy. In between checkups, there are plenty of things you can do on your own. Experts have done a lot of research about which lifestyle changes and preventive measures are most likely to keep you healthy. Ask your health care provider for more information. WEIGHT AND DIET  Eat a healthy diet  Be sure to include plenty of vegetables, fruits, low-fat dairy products, and lean protein.  Do not eat a lot of foods high in solid fats, added sugars, or salt.  Get regular exercise. This is one of the most important things you can do for your health.  Most adults should exercise for at least 150 minutes each week. The exercise should increase your heart rate and make you sweat (moderate-intensity exercise).  Most adults should also do strengthening exercises at least twice a week. This is in addition to the moderate-intensity exercise.  Maintain a healthy weight  Body mass index (BMI) is a measurement that can be used to identify possible weight problems. It estimates body fat based on height and weight. Your health care provider can help determine your BMI and help you achieve or maintain a healthy weight.  For females 20 years of age and older:   A BMI below 18.5 is considered underweight.  A BMI of 18.5 to 24.9 is normal.  A BMI of 25 to 29.9 is considered overweight.  A BMI of 30 and above is considered obese.  Watch levels of cholesterol and blood lipids  You should start having your blood tested for lipids and cholesterol at 77 years of age, then have this test every 5 years.  You may need to have your cholesterol levels checked more often if:  Your lipid  or cholesterol levels are high.  You are older than 77 years of age.  You are at high risk for heart disease.  CANCER SCREENING   Lung Cancer  Lung cancer screening is recommended for adults 55-80 years old who are at high risk for lung cancer because of a history of smoking.  A yearly low-dose CT scan of the lungs is recommended for people who:  Currently smoke.  Have quit within the past 15 years.  Have at least a 30-pack-year history of smoking. A pack year is smoking an average of one pack of cigarettes a day for 1 year.  Yearly screening should continue until it has been 15 years since you quit.  Yearly screening should stop if you develop a health problem that would prevent you from having lung cancer treatment.  Breast Cancer  Practice breast self-awareness. This means understanding how your breasts normally appear and feel.  It also means doing regular breast self-exams. Let your health care provider know about any changes, no matter how small.  If you are in your 20s or 30s, you should have a clinical breast exam (CBE) by a health care provider every 1-3 years as part of a regular health exam.  If you are 40 or older, have a CBE every year. Also consider having a breast X-ray (mammogram) every year.  If you have a family history of breast cancer, talk to your health care provider about genetic screening.  If you   are at high risk for breast cancer, talk to your health care provider about having an MRI and a mammogram every year.  Breast cancer gene (BRCA) assessment is recommended for women who have family members with BRCA-related cancers. BRCA-related cancers include:  Breast.  Ovarian.  Tubal.  Peritoneal cancers.  Results of the assessment will determine the need for genetic counseling and BRCA1 and BRCA2 testing. Cervical Cancer Your health care provider may recommend that you be screened regularly for cancer of the pelvic organs (ovaries, uterus, and  vagina). This screening involves a pelvic examination, including checking for microscopic changes to the surface of your cervix (Pap test). You may be encouraged to have this screening done every 3 years, beginning at age 21.  For women ages 30-65, health care providers may recommend pelvic exams and Pap testing every 3 years, or they may recommend the Pap and pelvic exam, combined with testing for human papilloma virus (HPV), every 5 years. Some types of HPV increase your risk of cervical cancer. Testing for HPV may also be done on women of any age with unclear Pap test results.  Other health care providers may not recommend any screening for nonpregnant women who are considered low risk for pelvic cancer and who do not have symptoms. Ask your health care provider if a screening pelvic exam is right for you.  If you have had past treatment for cervical cancer or a condition that could lead to cancer, you need Pap tests and screening for cancer for at least 20 years after your treatment. If Pap tests have been discontinued, your risk factors (such as having a new sexual partner) need to be reassessed to determine if screening should resume. Some women have medical problems that increase the chance of getting cervical cancer. In these cases, your health care provider may recommend more frequent screening and Pap tests. Colorectal Cancer  This type of cancer can be detected and often prevented.  Routine colorectal cancer screening usually begins at 77 years of age and continues through 77 years of age.  Your health care provider may recommend screening at an earlier age if you have risk factors for colon cancer.  Your health care provider may also recommend using home test kits to check for hidden blood in the stool.  A small camera at the end of a tube can be used to examine your colon directly (sigmoidoscopy or colonoscopy). This is done to check for the earliest forms of colorectal  cancer.  Routine screening usually begins at age 50.  Direct examination of the colon should be repeated every 5-10 years through 77 years of age. However, you may need to be screened more often if early forms of precancerous polyps or small growths are found. Skin Cancer  Check your skin from head to toe regularly.  Tell your health care provider about any new moles or changes in moles, especially if there is a change in a mole's shape or color.  Also tell your health care provider if you have a mole that is larger than the size of a pencil eraser.  Always use sunscreen. Apply sunscreen liberally and repeatedly throughout the day.  Protect yourself by wearing long sleeves, pants, a wide-brimmed hat, and sunglasses whenever you are outside. HEART DISEASE, DIABETES, AND HIGH BLOOD PRESSURE   High blood pressure causes heart disease and increases the risk of stroke. High blood pressure is more likely to develop in:  People who have blood pressure in the high end   of the normal range (130-139/85-89 mm Hg).  People who are overweight or obese.  People who are African American.  If you are 38-23 years of age, have your blood pressure checked every 3-5 years. If you are 61 years of age or older, have your blood pressure checked every year. You should have your blood pressure measured twice--once when you are at a hospital or clinic, and once when you are not at a hospital or clinic. Record the average of the two measurements. To check your blood pressure when you are not at a hospital or clinic, you can use:  An automated blood pressure machine at a pharmacy.  A home blood pressure monitor.  If you are between 45 years and 39 years old, ask your health care provider if you should take aspirin to prevent strokes.  Have regular diabetes screenings. This involves taking a blood sample to check your fasting blood sugar level.  If you are at a normal weight and have a low risk for diabetes,  have this test once every three years after 77 years of age.  If you are overweight and have a high risk for diabetes, consider being tested at a younger age or more often. PREVENTING INFECTION  Hepatitis B  If you have a higher risk for hepatitis B, you should be screened for this virus. You are considered at high risk for hepatitis B if:  You were born in a country where hepatitis B is common. Ask your health care provider which countries are considered high risk.  Your parents were born in a high-risk country, and you have not been immunized against hepatitis B (hepatitis B vaccine).  You have HIV or AIDS.  You use needles to inject street drugs.  You live with someone who has hepatitis B.  You have had sex with someone who has hepatitis B.  You get hemodialysis treatment.  You take certain medicines for conditions, including cancer, organ transplantation, and autoimmune conditions. Hepatitis C  Blood testing is recommended for:  Everyone born from [birth year] through 1967.  Anyone with known risk factors for hepatitis C. Sexually transmitted infections (STIs)  You should be screened for sexually transmitted infections (STIs) including gonorrhea and chlamydia if:  You are sexually active and are younger than 77 years of age.  You are older than 77 years of age and your health care provider tells you that you are at risk for this type of infection.  Your sexual activity has changed since you were last screened and you are at an increased risk for chlamydia or gonorrhea. Ask your health care provider if you are at risk.  If you do not have HIV, but are at risk, it may be recommended that you take a prescription medicine daily to prevent HIV infection. This is called pre-exposure prophylaxis (PrEP). You are considered at risk if:  You are sexually active and do not regularly use condoms or know the HIV status of your partner(s).  You take drugs by injection.  You are sexually  active with a partner who has HIV. Talk with your health care provider about whether you are at high risk of being infected with HIV. If you choose to begin PrEP, you should first be tested for HIV. You should then be tested every 3 months for as long as you are taking PrEP.  PREGNANCY   If you are premenopausal and you may become pregnant, ask your health care provider about preconception counseling.  If you may  become pregnant, take 400 to 800 micrograms (mcg) of folic acid every day.  If you want to prevent pregnancy, talk to your health care provider about birth control (contraception). OSTEOPOROSIS AND MENOPAUSE   Osteoporosis is a disease in which the bones lose minerals and strength with aging. This can result in serious bone fractures. Your risk for osteoporosis can be identified using a bone density scan.  If you are 61 years of age or older, or if you are at risk for osteoporosis and fractures, ask your health care provider if you should be screened.  Ask your health care provider whether you should take a calcium or vitamin D supplement to lower your risk for osteoporosis.  Menopause may have certain physical symptoms and risks.  Hormone replacement therapy may reduce some of these symptoms and risks. Talk to your health care provider about whether hormone replacement therapy is right for you.  HOME CARE INSTRUCTIONS   Schedule regular health, dental, and eye exams.  Stay current with your immunizations.   Do not use any tobacco products including cigarettes, chewing tobacco, or electronic cigarettes.  If you are pregnant, do not drink alcohol.  If you are breastfeeding, limit how much and how often you drink alcohol.  Limit alcohol intake to no more than 1 drink per day for nonpregnant women. One drink equals 12 ounces of beer, 5 ounces of wine, or 1 ounces of hard liquor.  Do not use street drugs.  Do not share needles.  Ask your health care provider for help if  you need support or information about quitting drugs.  Tell your health care provider if you often feel depressed.  Tell your health care provider if you have ever been abused or do not feel safe at home.   This information is not intended to replace advice given to you by your health care provider. Make sure you discuss any questions you have with your health care provider.   Document Released: 08/21/2010 Document Revised: 02/26/2014 Document Reviewed: 01/07/2013 Elsevier Interactive Patient Education [employer].

## 2015-06-07 NOTE — Addendum Note (Signed)
Addended by: Jannifer Rodney A on: 06/07/2015 02:46 PM   Modules accepted: Orders

## 2015-06-13 ENCOUNTER — Other Ambulatory Visit: Payer: Self-pay | Admitting: Family

## 2015-06-13 NOTE — Telephone Encounter (Signed)
Looks like this was printed earlier this month?

## 2015-06-14 DIAGNOSIS — Z79899 Other long term (current) drug therapy: Secondary | ICD-10-CM | POA: Diagnosis not present

## 2015-06-14 DIAGNOSIS — M0579 Rheumatoid arthritis with rheumatoid factor of multiple sites without organ or systems involvement: Secondary | ICD-10-CM | POA: Diagnosis not present

## 2015-06-14 DIAGNOSIS — M2550 Pain in unspecified joint: Secondary | ICD-10-CM | POA: Diagnosis not present

## 2015-09-13 DIAGNOSIS — M0579 Rheumatoid arthritis with rheumatoid factor of multiple sites without organ or systems involvement: Secondary | ICD-10-CM | POA: Diagnosis not present

## 2015-09-13 DIAGNOSIS — M2550 Pain in unspecified joint: Secondary | ICD-10-CM | POA: Diagnosis not present

## 2015-09-13 DIAGNOSIS — Z79899 Other long term (current) drug therapy: Secondary | ICD-10-CM | POA: Diagnosis not present

## 2015-09-27 ENCOUNTER — Encounter: Payer: Self-pay | Admitting: Family

## 2015-09-27 ENCOUNTER — Ambulatory Visit (INDEPENDENT_AMBULATORY_CARE_PROVIDER_SITE_OTHER): Payer: PPO | Admitting: Family

## 2015-09-27 VITALS — BP 132/77 | HR 109 | Temp 97.0°F | Ht 60.0 in | Wt 116.8 lb

## 2015-09-27 DIAGNOSIS — L8994 Pressure ulcer of unspecified site, stage 4: Secondary | ICD-10-CM | POA: Diagnosis not present

## 2015-09-27 NOTE — Patient Instructions (Signed)
Pressure Injury °A pressure injury, sometimes called a bedsore, is an injury to the skin and underlying tissue caused by pressure. Pressure on blood vessels causes decreased blood flow to the skin, which can eventually cause the skin tissue to die and break down into a wound. °Pressure injuries usually occur: °· Over bony parts of the body such as the tailbone, shoulders, elbows, hips, and heels. °· Under medical devices such as respiratory equipment, stockings, tubes, and splints. °Pressure injuries start as reddened areas on the skin and can lead to pain, muscle damage, and infection. Pressure injuries can vary in severity.  °CAUSES °Pressure injuries are caused by a lack of blood supply to an area of skin. They can occur from intense pressure over a short period of time or from less intense pressure over a long period of time. °RISK FACTORS °This condition is more likely to develop in people who: °· Are in the hospital or an extended care facility. °· Are bedridden or in a wheelchair. °· Have an injury or disease that keeps them from: °¨ Moving normally. °¨ Feeling pain or pressure. °· Have a condition that: °¨ Makes them sleepy or less alert. °¨ Causes poor blood flow. °· Need to wear a medical device. °· Have poor control of their bladder or bowel functions (incontinence). °· Have poor nutrition (malnutrition). °· Are of certain ethnicities. People of African American and Latino or Hispanic descent are at higher risk compared to other ethnic groups. °If you are at risk for pressure ulcers, your health care provider may recommend certain types of bedding to help prevent them. These may include foam or gel mattresses covered with one of the following: °· A sheepskin blanket. °· A pad that is filled with gel, air, water, or foam. °SYMPTOMS  °The main symptom is a blister or change in skin color that opens into a wound. Other symptoms include:  °· Red or dark areas of skin that do not turn white or pale when  pressed with a finger.   °· Pain, warmth, or change of skin texture.   °DIAGNOSIS °This condition is diagnosed with a medical history and physical exam. You may also have tests, including:  °· Blood tests to check for infection or signs of poor nutrition. °· Imaging studies to check for damage to the deep tissues under your skin. °· Blood flow studies. °Your pressure injury will be staged to determine its severity. Staging is an assessment of: °· The depth of the pressure injury. °· Which tissues are exposed because of the pressure injury. °· The causes of the pressure injury.  °TREATMENT °The main focus of treatment is to help your injury heal. This may be done by:  °· Relieving or redistributing pressure on your skin. This includes: °¨ Frequently changing your position. °¨ Eliminating or minimizing positions that caused the wound or that can make the wound worse. °¨ Using specific bed mattresses and chair cushions. °¨ Refitting, resizing, or replacing any medical devices, or padding the skin under them. °¨ Using creams or powders to prevent rubbing (friction) on the skin. °· Keeping your skin clean and dry. This may include using a skin cleanser or skin protectant as told by your health care provider. This may be a lotion, ointment, or spray. °· Cleaning your injury and removing any dead tissue from the wound (debridement). °· Placing a bandage (dressing) over your injury. °· Preventing or treating infection. This may include antibiotic, antimicrobial, or antiseptic medicines. °Treatment may also include medicine for pain. °  Sometimes surgery is needed to close the wound with a flap of healthy skin or a piece of skin from another area of your body (graft). You may need surgery if other treatments are not working or if your injury is very deep. °HOME CARE INSTRUCTIONS °Wound Care °· Follow instructions from your health care provider about: °¨ How to take care of your wound. °¨ When and how you should change your  dressing. °¨ When you should remove your dressing. If your dressing is dry and stuck when you try to remove it, moisten or wet the dressing with saline or water so that it can be removed without harming your skin or wound tissue. °· Check your wound every day for signs of infection. Have a caregiver do this for you if you are not able. Watch for: °¨ More redness, swelling, or pain. °¨ More fluid, blood, or pus. °¨ A bad smell. °Skin Care °· Keep your skin clean and dry. Gently pat your skin dry. °· Do not rub or massage your skin. °· Use a skin protectant only as told by your health care provider. °· Check your skin every day for any changes in color or any new blisters or sores (ulcers). Have a caregiver do this for you if you are not able. °Medicines  °· Take over-the-counter and prescription medicines only as told by your health care provider. °· If you were prescribed an antibiotic medicine, take it or apply it as told by your health care provider. Do not stop taking or using the antibiotic even if your condition improves. °Reducing and Redistributing Pressure °· Do not lie or sit in one position for a long time. Move or change position every two hours or as told by your health care provider. °· Use pillows or cushions to reduce pressure. Ask your health care provider to recommend cushions or pads for you. °· Use medical devices that do not rub your skin. Tell your health care provider if one of your medical devices is causing a pressure injury to develop. °General Instructions °· Eat a healthy diet that includes lots of protein. Ask your health care provider for diet advice. °· Drink enough fluid to keep your urine clear or pale yellow. °· Be as active as you can every day. Ask your health care provider to suggest safe exercises or activities. °· Do not abuse drugs or alcohol. °· Keep all follow-up visits as told by your health care provider. This is important. °· Do not smoke. °SEEK MEDICAL CARE IF: °· You  have chills or fever. °· Your pain medicine is not helping. °· You have any changes in skin color. °· You have new blisters or sores. °· You develop warmth, redness, or swelling near a pressure injury. °· You have a bad odor or pus coming from your pressure injury. °· You lose control of your bowels or bladder. °· You develop new symptoms. °· Your wound does not improve after 1-2 weeks of treatment. °· You develop a new medical condition, such as diabetes, peripheral vascular disease, or conditions that affect your defense (immune) system. °  °This information is not intended to replace advice given to you by your health care provider. Make sure you discuss any questions you have with your health care provider. °  °Document Released: 02/05/2005 Document Revised: 10/27/2014 Document Reviewed: 06/16/2014 °Elsevier Interactive Patient Education ©2016 [employer] ° °

## 2015-09-27 NOTE — Progress Notes (Signed)
   Subjective:    Patient ID: Denise Webster, female    DOB: Nov 13, 1940, 75 y.o.   MRN: [id number]  HPI Pt presents to the office today with a "sore on her butt". Pt states she noticed it 6 weeks ago and can not get it healed. Pt states she has applied antibiotic ointment and badges. Pt states she has rheumatoid arthritis and sits for long periods of time. PT states it is draining a constant brown, yellow drainage.    Review of Systems  Constitutional: Negative.   HENT: Negative.   Eyes: Negative.   Respiratory: Negative.  Negative for shortness of breath.   Cardiovascular: Negative.  Negative for palpitations.  Gastrointestinal: Negative.   Endocrine: Negative.   Genitourinary: Negative.   Musculoskeletal: Positive for arthralgias.  Skin: Positive for wound.  Neurological: Negative.  Negative for headaches.  Hematological: Negative.   Psychiatric/Behavioral: Negative.   All other systems reviewed and are negative.      Objective:   Physical Exam  Constitutional: She is oriented to person, place, and time. She appears well-developed and well-nourished. No distress.  HENT:  Head: Normocephalic.  Eyes: Pupils are equal, round, and reactive to light.  Neck: Normal range of motion. Neck supple. No thyromegaly present.  Cardiovascular: Normal rate, regular rhythm, normal heart sounds and intact distal pulses.   No murmur heard. Pulmonary/Chest: Effort normal and breath sounds normal. No respiratory distress. She has no wheezes.  Abdominal: Soft. Bowel sounds are normal. She exhibits no distension. There is no tenderness.  Musculoskeletal: Normal range of motion. She exhibits no edema or tenderness.  Neurological: She is alert and oriented to person, place, and time. She has normal reflexes. No cranial nerve deficit.  Skin: Skin is warm and dry. There is erythema.  Sacral stage 4 pressure ulcer present, with purulent drainage, tunneling 2cm that is approm 4cm X 4 cm   Psychiatric:  She has a normal mood and affect. Her behavior is normal. Judgment and thought content normal.  Vitals reviewed.  Area cleaned and mepilex dressing applied  BP 132/77   Pulse (!) 109   Temp 97 F (36.1 C) (Oral)   Ht 5' (1.524 m)   Wt 116 lb 12.8 oz (53 kg)   BMI 22.81 kg/m       Assessment & Plan:  1. Pressure ulcer stage IV (HCC) -Discussed importance of changing positions every 2 hours -Do not get dressing wet -Do not rub -RTO in 3 days if not at wound care - Ambulatory referral to Pain Clinic  Jannifer Rodney, FNP

## 2015-09-28 ENCOUNTER — Telehealth: Payer: Self-pay | Admitting: Family

## 2015-09-28 NOTE — Telephone Encounter (Signed)
Spoke to [place] and she states the pt doesn't have a fever or chills and has a follow up appt with [facility] 8/11. Advised pt to keep appt with [facility] and monitor for fevers etc.

## 2015-09-28 NOTE — Telephone Encounter (Signed)
Pt given appt with Christy Hawks tomorrow at 9:40. 

## 2015-09-29 ENCOUNTER — Ambulatory Visit (INDEPENDENT_AMBULATORY_CARE_PROVIDER_SITE_OTHER): Payer: PPO | Admitting: Family

## 2015-09-29 ENCOUNTER — Encounter: Payer: Self-pay | Admitting: Family

## 2015-09-29 VITALS — BP 124/70 | HR 95 | Temp 97.8°F | Ht 60.0 in | Wt 119.0 lb

## 2015-09-29 DIAGNOSIS — L89154 Pressure ulcer of sacral region, stage 4: Secondary | ICD-10-CM | POA: Diagnosis not present

## 2015-09-29 DIAGNOSIS — Z5189 Encounter for other specified aftercare: Secondary | ICD-10-CM

## 2015-09-29 DIAGNOSIS — Z4800 Encounter for change or removal of nonsurgical wound dressing: Secondary | ICD-10-CM

## 2015-09-29 NOTE — Progress Notes (Signed)
   Subjective:    Patient ID: Denise Webster, female    DOB: [birth year], 75 y.o.   MRN: [id number]  HPI Pt presents to the office today for dressing change and wound check. Pt was seen on 09/27/15 and was diagnosed with a sacrum stage 4 pressure ulcer. We had placed mepilex dressing and referred pt to wound care. Pt can not get into wound care until 10/17/15. Pt states she is having moderate amount of yellow, brown thick discharge. Pt states her pain is improved and states she currently she is not having any pain at this time.    Review of Systems  HENT: Negative for postnasal drip and rhinorrhea.   Respiratory: Negative for shortness of breath.   Cardiovascular: Negative for chest pain and palpitations.  Gastrointestinal: Negative for abdominal distention, anal bleeding, blood in stool and constipation.  Musculoskeletal: Positive for arthralgias.  Skin: Positive for wound.  Psychiatric/Behavioral: Negative.        Objective:   Physical Exam  Constitutional: She is oriented to person, place, and time. She appears well-developed and well-nourished. No distress.  Cardiovascular: Normal rate, regular rhythm, normal heart sounds and intact distal pulses.   No murmur heard. Pulmonary/Chest: Effort normal and breath sounds normal. No respiratory distress. She has no wheezes.  Abdominal: Soft. Bowel sounds are normal. She exhibits no distension. There is no tenderness.  Musculoskeletal: Normal range of motion. She exhibits no edema or tenderness.  Neurological: She is alert and oriented to person, place, and time.  Skin: Skin is warm and dry.  Stage 4 pressure ulcer present on sacrum area. Wound tunneling 1.8cm and approx 4cmx 4cm. purulent drainage present  Psychiatric: She has a normal mood and affect. Her behavior is normal. Judgment and thought content normal.  Vitals reviewed.   BP 124/70   Pulse 95   Temp 97.8 F (36.6 C) (Oral)   Ht 5' (1.524 m)   Wt 119 lb (54 kg)   BMI 23.24  kg/m   Area cleaned area packed, and gauze and abd pad applied.      Assessment & Plan:  1. Pressure ulcer of sacral region, stage 4 [facility]) - Home Health - Face-to-face encounter (required for Medicare/Medicaid patients)  2. Encounter for wound re-check - Home Health - Face-to-face encounter (required for Medicare/Medicaid patients)  3. Encounter for change of dressing - Home Health - Face-to-face encounter (required for Medicare/Medicaid patients)  Pt has follow up appt Saturday and I have ordered home health. Pt can not get into wound care until 10/17/15. Discussed importance of changing positions every 1 1/2 hours -2 hours.  Keep follow up appt  Jannifer Rodney, FNP

## 2015-09-29 NOTE — Patient Instructions (Signed)
Skin Ulcer  A skin ulcer is an open sore that can be shallow or deep. Skin ulcers sometimes become infected and are difficult to treat. It may be 1 month or longer before real healing progress is made.  CAUSES   · Injury.  · Problems with the veins or arteries.  · Diabetes.  · Insect bites.  · Bedsores.  · Inflammatory conditions.  SYMPTOMS   · Pain, redness, swelling, and tenderness around the ulcer.  · Fever.  · Bleeding from the ulcer.  · Yellow or clear fluid coming from the ulcer.  DIAGNOSIS   There are many types of skin ulcers. Any open sores will be examined. Certain tests will be done to determine the kind of ulcer you have. The right treatment depends on the type of ulcer you have.  TREATMENT   Treatment is a long-term challenge. It may include:  · Wearing an elastic wrap, compression stockings, or gel cast over the ulcer area.  · Taking antibiotic medicines or putting antibiotic creams on the affected area if there is an infection.  HOME CARE INSTRUCTIONS  · Put on your bandages (dressings), wraps, or casts over the ulcer as directed by your caregiver.  · Change all dressings as directed by your caregiver.  · Take all medicines as directed by your caregiver.  · Keep the affected area clean and dry.  · Avoid injuries to the affected area.  · Eat a well-balanced, healthy diet that includes plenty of fruit and vegetables.  · If you smoke, consider quitting or decreasing the amount of cigarettes you smoke.  · Once the ulcer heals, get regular exercise as directed by your caregiver.  · Work with your caregiver to make sure your blood pressure, cholesterol, and diabetes are well-controlled.  · Keep your skin moisturized. Dry skin can crack and lead to skin ulcers.  SEEK IMMEDIATE MEDICAL CARE IF:   · Your pain gets worse.  · You have swelling, redness, or fluids around the ulcer.  · You have chills.  · You have a fever.  MAKE SURE YOU:   · Understand these instructions.  · Will watch your condition.  · Will get  help right away if you are not doing well or get worse.     This information is not intended to replace advice given to you by your health care provider. Make sure you discuss any questions you have with your health care provider.     Document Released: 03/15/2004 Document Revised: 04/30/2011 Document Reviewed: 09/22/2010  Elsevier Interactive Patient Education ©2016 [employer] Inc[employer]

## 2015-09-30 ENCOUNTER — Ambulatory Visit: Payer: PPO | Admitting: Family

## 2015-10-01 ENCOUNTER — Ambulatory Visit (INDEPENDENT_AMBULATORY_CARE_PROVIDER_SITE_OTHER): Payer: PPO | Admitting: Family

## 2015-10-01 ENCOUNTER — Encounter: Payer: Self-pay | Admitting: Family

## 2015-10-01 VITALS — BP 116/75 | HR 88 | Temp 97.1°F | Ht 60.0 in | Wt 119.0 lb

## 2015-10-01 DIAGNOSIS — Z4800 Encounter for change or removal of nonsurgical wound dressing: Secondary | ICD-10-CM | POA: Diagnosis not present

## 2015-10-01 DIAGNOSIS — L89154 Pressure ulcer of sacral region, stage 4: Secondary | ICD-10-CM | POA: Diagnosis not present

## 2015-10-01 DIAGNOSIS — Z5189 Encounter for other specified aftercare: Secondary | ICD-10-CM

## 2015-10-01 NOTE — Progress Notes (Signed)
   Subjective:    Patient ID: Denise Webster, female    DOB: Feb 06, 1941, 75 y.o.   MRN: [id number]  Wound Check  She was originally treated more than 14 days ago. Previous treatment included wound cleansing or irrigation. The maximum temperature noted was less than 100.4 F. There has been colored discharge from the wound. The redness has improved. There is no swelling present. The pain has improved.  Pt presents to the office today for dressing change and wound check. Pt was seen on 09/27/15 and was diagnosed with a sacrum stage 4 pressure ulcer.  Pt can not get into wound care until 10/17/15 and her last visit we placed order for home health. Pt was seen on 058/10/17 and had the area packed and dressing applied. Pt states her pain is improved and states she currently she is not having any pain at this time and denies any drainage through dressing. Pt reports getting up and moving more and changing positions every 2 hours and eating a high protein diet.    Review of Systems  HENT: Negative for postnasal drip and rhinorrhea.   Respiratory: Negative for shortness of breath.   Cardiovascular: Negative for chest pain and palpitations.  Gastrointestinal: Negative for abdominal distention, anal bleeding, blood in stool and constipation.  Musculoskeletal: Positive for arthralgias.  Skin: Positive for wound.  Psychiatric/Behavioral: Negative.        Objective:   Physical Exam  Constitutional: She is oriented to person, place, and time. She appears well-developed and well-nourished. No distress.  Cardiovascular: Normal rate, regular rhythm, normal heart sounds and intact distal pulses.   No murmur heard. Pulmonary/Chest: Effort normal and breath sounds normal. No respiratory distress. She has no wheezes.  Abdominal: Soft. Bowel sounds are normal. She exhibits no distension. There is no tenderness.  Musculoskeletal: Normal range of motion. She exhibits no edema or tenderness.  Neurological: She is  alert and oriented to person, place, and time.  Skin: Skin is warm and dry.  Stage 4 pressure ulcer present on sacrum area. Wound tunneling 1.6cm and approx 4cmx 4cm. purulent drainage present. Area improved!  Psychiatric: She has a normal mood and affect. Her behavior is normal. Judgment and thought content normal.  Vitals reviewed.   BP 116/75 (BP Location: Left Arm)   Pulse 88   Temp 97.1 F (36.2 C) (Oral)   Ht 5' (1.524 m)   Wt 119 lb (54 kg)   BMI 23.24 kg/m   Dressing and packing removed. Area cleaned area packed, and gauze and abd pad applied.      Assessment & Plan:  1. Pressure ulcer of sacral region, stage 4 (HCC)  2. Encounter for wound re-check  3. Change or removal of wound dressing    Pt has follow up appt Tuesdat and I have ordered home health. Pt can not get into wound care until 10/17/15. Hopefully home health will be able to start this week? Discussed importance of changing positions every 1 1/2 hours -2 hours.  Continue high protein diet Keep follow up appt  Jannifer Rodney, FNP

## 2015-10-01 NOTE — Patient Instructions (Signed)
Pressure Injury °A pressure injury, sometimes called a bedsore, is an injury to the skin and underlying tissue caused by pressure. Pressure on blood vessels causes decreased blood flow to the skin, which can eventually cause the skin tissue to die and break down into a wound. °Pressure injuries usually occur: °· Over bony parts of the body such as the tailbone, shoulders, elbows, hips, and heels. °· Under medical devices such as respiratory equipment, stockings, tubes, and splints. °Pressure injuries start as reddened areas on the skin and can lead to pain, muscle damage, and infection. Pressure injuries can vary in severity.  °CAUSES °Pressure injuries are caused by a lack of blood supply to an area of skin. They can occur from intense pressure over a short period of time or from less intense pressure over a long period of time. °RISK FACTORS °This condition is more likely to develop in people who: °· Are in the hospital or an extended care facility. °· Are bedridden or in a wheelchair. °· Have an injury or disease that keeps them from: °¨ Moving normally. °¨ Feeling pain or pressure. °· Have a condition that: °¨ Makes them sleepy or less alert. °¨ Causes poor blood flow. °· Need to wear a medical device. °· Have poor control of their bladder or bowel functions (incontinence). °· Have poor nutrition (malnutrition). °· Are of certain ethnicities. People of African American and Latino or Hispanic descent are at higher risk compared to other ethnic groups. °If you are at risk for pressure ulcers, your health care provider may recommend certain types of bedding to help prevent them. These may include foam or gel mattresses covered with one of the following: °· A sheepskin blanket. °· A pad that is filled with gel, air, water, or foam. °SYMPTOMS  °The main symptom is a blister or change in skin color that opens into a wound. Other symptoms include:  °· Red or dark areas of skin that do not turn white or pale when  pressed with a finger.   °· Pain, warmth, or change of skin texture.   °DIAGNOSIS °This condition is diagnosed with a medical history and physical exam. You may also have tests, including:  °· Blood tests to check for infection or signs of poor nutrition. °· Imaging studies to check for damage to the deep tissues under your skin. °· Blood flow studies. °Your pressure injury will be staged to determine its severity. Staging is an assessment of: °· The depth of the pressure injury. °· Which tissues are exposed because of the pressure injury. °· The causes of the pressure injury.  °TREATMENT °The main focus of treatment is to help your injury heal. This may be done by:  °· Relieving or redistributing pressure on your skin. This includes: °¨ Frequently changing your position. °¨ Eliminating or minimizing positions that caused the wound or that can make the wound worse. °¨ Using specific bed mattresses and chair cushions. °¨ Refitting, resizing, or replacing any medical devices, or padding the skin under them. °¨ Using creams or powders to prevent rubbing (friction) on the skin. °· Keeping your skin clean and dry. This may include using a skin cleanser or skin protectant as told by your health care provider. This may be a lotion, ointment, or spray. °· Cleaning your injury and removing any dead tissue from the wound (debridement). °· Placing a bandage (dressing) over your injury. °· Preventing or treating infection. This may include antibiotic, antimicrobial, or antiseptic medicines. °Treatment may also include medicine for pain. °  Sometimes surgery is needed to close the wound with a flap of healthy skin or a piece of skin from another area of your body (graft). You may need surgery if other treatments are not working or if your injury is very deep. °HOME CARE INSTRUCTIONS °Wound Care °· Follow instructions from your health care provider about: °¨ How to take care of your wound. °¨ When and how you should change your  dressing. °¨ When you should remove your dressing. If your dressing is dry and stuck when you try to remove it, moisten or wet the dressing with saline or water so that it can be removed without harming your skin or wound tissue. °· Check your wound every day for signs of infection. Have a caregiver do this for you if you are not able. Watch for: °¨ More redness, swelling, or pain. °¨ More fluid, blood, or pus. °¨ A bad smell. °Skin Care °· Keep your skin clean and dry. Gently pat your skin dry. °· Do not rub or massage your skin. °· Use a skin protectant only as told by your health care provider. °· Check your skin every day for any changes in color or any new blisters or sores (ulcers). Have a caregiver do this for you if you are not able. °Medicines  °· Take over-the-counter and prescription medicines only as told by your health care provider. °· If you were prescribed an antibiotic medicine, take it or apply it as told by your health care provider. Do not stop taking or using the antibiotic even if your condition improves. °Reducing and Redistributing Pressure °· Do not lie or sit in one position for a long time. Move or change position every two hours or as told by your health care provider. °· Use pillows or cushions to reduce pressure. Ask your health care provider to recommend cushions or pads for you. °· Use medical devices that do not rub your skin. Tell your health care provider if one of your medical devices is causing a pressure injury to develop. °General Instructions °· Eat a healthy diet that includes lots of protein. Ask your health care provider for diet advice. °· Drink enough fluid to keep your urine clear or pale yellow. °· Be as active as you can every day. Ask your health care provider to suggest safe exercises or activities. °· Do not abuse drugs or alcohol. °· Keep all follow-up visits as told by your health care provider. This is important. °· Do not smoke. °SEEK MEDICAL CARE IF: °· You  have chills or fever. °· Your pain medicine is not helping. °· You have any changes in skin color. °· You have new blisters or sores. °· You develop warmth, redness, or swelling near a pressure injury. °· You have a bad odor or pus coming from your pressure injury. °· You lose control of your bowels or bladder. °· You develop new symptoms. °· Your wound does not improve after 1-2 weeks of treatment. °· You develop a new medical condition, such as diabetes, peripheral vascular disease, or conditions that affect your defense (immune) system. °  °This information is not intended to replace advice given to you by your health care provider. Make sure you discuss any questions you have with your health care provider. °  °Document Released: 02/05/2005 Document Revised: 10/27/2014 Document Reviewed: 06/16/2014 °Elsevier Interactive Patient Education ©2016 [employer] ° °

## 2015-10-03 ENCOUNTER — Ambulatory Visit: Payer: PPO | Admitting: Family

## 2015-10-04 ENCOUNTER — Encounter: Payer: Self-pay | Admitting: Family

## 2015-10-04 ENCOUNTER — Ambulatory Visit (INDEPENDENT_AMBULATORY_CARE_PROVIDER_SITE_OTHER): Payer: PPO | Admitting: Family

## 2015-10-04 VITALS — BP 116/75 | HR 118 | Temp 97.2°F | Ht 60.0 in | Wt 116.4 lb

## 2015-10-04 DIAGNOSIS — Z4800 Encounter for change or removal of nonsurgical wound dressing: Secondary | ICD-10-CM

## 2015-10-04 DIAGNOSIS — L89154 Pressure ulcer of sacral region, stage 4: Secondary | ICD-10-CM

## 2015-10-04 DIAGNOSIS — Z5189 Encounter for other specified aftercare: Secondary | ICD-10-CM | POA: Diagnosis not present

## 2015-10-04 NOTE — Patient Instructions (Signed)
Pressure Injury °A pressure injury, sometimes called a bedsore, is an injury to the skin and underlying tissue caused by pressure. Pressure on blood vessels causes decreased blood flow to the skin, which can eventually cause the skin tissue to die and break down into a wound. °Pressure injuries usually occur: °· Over bony parts of the body such as the tailbone, shoulders, elbows, hips, and heels. °· Under medical devices such as respiratory equipment, stockings, tubes, and splints. °Pressure injuries start as reddened areas on the skin and can lead to pain, muscle damage, and infection. Pressure injuries can vary in severity.  °CAUSES °Pressure injuries are caused by a lack of blood supply to an area of skin. They can occur from intense pressure over a short period of time or from less intense pressure over a long period of time. °RISK FACTORS °This condition is more likely to develop in people who: °· Are in the hospital or an extended care facility. °· Are bedridden or in a wheelchair. °· Have an injury or disease that keeps them from: °¨ Moving normally. °¨ Feeling pain or pressure. °· Have a condition that: °¨ Makes them sleepy or less alert. °¨ Causes poor blood flow. °· Need to wear a medical device. °· Have poor control of their bladder or bowel functions (incontinence). °· Have poor nutrition (malnutrition). °· Are of certain ethnicities. People of African American and Latino or Hispanic descent are at higher risk compared to other ethnic groups. °If you are at risk for pressure ulcers, your health care provider may recommend certain types of bedding to help prevent them. These may include foam or gel mattresses covered with one of the following: °· A sheepskin blanket. °· A pad that is filled with gel, air, water, or foam. °SYMPTOMS  °The main symptom is a blister or change in skin color that opens into a wound. Other symptoms include:  °· Red or dark areas of skin that do not turn white or pale when  pressed with a finger.   °· Pain, warmth, or change of skin texture.   °DIAGNOSIS °This condition is diagnosed with a medical history and physical exam. You may also have tests, including:  °· Blood tests to check for infection or signs of poor nutrition. °· Imaging studies to check for damage to the deep tissues under your skin. °· Blood flow studies. °Your pressure injury will be staged to determine its severity. Staging is an assessment of: °· The depth of the pressure injury. °· Which tissues are exposed because of the pressure injury. °· The causes of the pressure injury.  °TREATMENT °The main focus of treatment is to help your injury heal. This may be done by:  °· Relieving or redistributing pressure on your skin. This includes: °¨ Frequently changing your position. °¨ Eliminating or minimizing positions that caused the wound or that can make the wound worse. °¨ Using specific bed mattresses and chair cushions. °¨ Refitting, resizing, or replacing any medical devices, or padding the skin under them. °¨ Using creams or powders to prevent rubbing (friction) on the skin. °· Keeping your skin clean and dry. This may include using a skin cleanser or skin protectant as told by your health care provider. This may be a lotion, ointment, or spray. °· Cleaning your injury and removing any dead tissue from the wound (debridement). °· Placing a bandage (dressing) over your injury. °· Preventing or treating infection. This may include antibiotic, antimicrobial, or antiseptic medicines. °Treatment may also include medicine for pain. °  Sometimes surgery is needed to close the wound with a flap of healthy skin or a piece of skin from another area of your body (graft). You may need surgery if other treatments are not working or if your injury is very deep. °HOME CARE INSTRUCTIONS °Wound Care °· Follow instructions from your health care provider about: °¨ How to take care of your wound. °¨ When and how you should change your  dressing. °¨ When you should remove your dressing. If your dressing is dry and stuck when you try to remove it, moisten or wet the dressing with saline or water so that it can be removed without harming your skin or wound tissue. °· Check your wound every day for signs of infection. Have a caregiver do this for you if you are not able. Watch for: °¨ More redness, swelling, or pain. °¨ More fluid, blood, or pus. °¨ A bad smell. °Skin Care °· Keep your skin clean and dry. Gently pat your skin dry. °· Do not rub or massage your skin. °· Use a skin protectant only as told by your health care provider. °· Check your skin every day for any changes in color or any new blisters or sores (ulcers). Have a caregiver do this for you if you are not able. °Medicines  °· Take over-the-counter and prescription medicines only as told by your health care provider. °· If you were prescribed an antibiotic medicine, take it or apply it as told by your health care provider. Do not stop taking or using the antibiotic even if your condition improves. °Reducing and Redistributing Pressure °· Do not lie or sit in one position for a long time. Move or change position every two hours or as told by your health care provider. °· Use pillows or cushions to reduce pressure. Ask your health care provider to recommend cushions or pads for you. °· Use medical devices that do not rub your skin. Tell your health care provider if one of your medical devices is causing a pressure injury to develop. °General Instructions °· Eat a healthy diet that includes lots of protein. Ask your health care provider for diet advice. °· Drink enough fluid to keep your urine clear or pale yellow. °· Be as active as you can every day. Ask your health care provider to suggest safe exercises or activities. °· Do not abuse drugs or alcohol. °· Keep all follow-up visits as told by your health care provider. This is important. °· Do not smoke. °SEEK MEDICAL CARE IF: °· You  have chills or fever. °· Your pain medicine is not helping. °· You have any changes in skin color. °· You have new blisters or sores. °· You develop warmth, redness, or swelling near a pressure injury. °· You have a bad odor or pus coming from your pressure injury. °· You lose control of your bowels or bladder. °· You develop new symptoms. °· Your wound does not improve after 1-2 weeks of treatment. °· You develop a new medical condition, such as diabetes, peripheral vascular disease, or conditions that affect your defense (immune) system. °  °This information is not intended to replace advice given to you by your health care provider. Make sure you discuss any questions you have with your health care provider. °  °Document Released: 02/05/2005 Document Revised: 10/27/2014 Document Reviewed: 06/16/2014 °Elsevier Interactive Patient Education ©2016 [employer] ° °

## 2015-10-04 NOTE — Progress Notes (Signed)
   Subjective:    Patient ID: Denise Webster, female    DOB: 1940/05/15, 75 y.o.   MRN: [id number]  Wound Check  She was originally treated more than 14 days ago. Previous treatment included wound cleansing or irrigation. The maximum temperature noted was less than 100.4 F. There has been colored discharge from the wound. The redness has worsened. There is no swelling present. The pain has improved.   Pt presents to the office today for dressing change and wound check. Pt was seen on 09/27/15 and was diagnosed with a sacrum stage 4 pressure ulcer.  Pt can not get into wound care until 10/17/15 and her last visit we placed order for home health. Pt was seen on 058/10/17 and had the area packed and dressing applied. Pt states her pain is improved and states she currently she is not having any pain at this time and denies any drainage through dressing. Pt reports getting up and moving more and changing positions every 2 hours and eating a high protein diet.    Review of Systems  HENT: Negative for postnasal drip and rhinorrhea.   Respiratory: Negative for shortness of breath.   Cardiovascular: Negative for chest pain and palpitations.  Gastrointestinal: Negative for abdominal distention, anal bleeding, blood in stool and constipation.  Musculoskeletal: Positive for arthralgias.  Skin: Positive for wound.  Psychiatric/Behavioral: Negative.        Objective:   Physical Exam  Constitutional: She is oriented to person, place, and time. She appears well-developed and well-nourished. No distress.  Cardiovascular: Normal rate, regular rhythm, normal heart sounds and intact distal pulses.   No murmur heard. Pulmonary/Chest: Effort normal and breath sounds normal. No respiratory distress.  Abdominal: Soft. Bowel sounds are normal. She exhibits no distension. There is no tenderness.  Musculoskeletal: Normal range of motion.  Neurological: She is alert and oriented to person, place, and time.  Skin:  Skin is warm and dry.  Stage 4 pressure ulcer present on sacrum area. Wound tunneling 2cm and approx 4cmx 4cm. purulent drainage present. Skin around erythemas today.   Psychiatric: She has a normal mood and affect. Her behavior is normal. Judgment and thought content normal.  Vitals reviewed.   BP 116/75   Pulse (!) 118   Temp 97.2 F (36.2 C) (Oral)   Ht 5' (1.524 m)   Wt 116 lb 6.4 oz (52.8 kg)   BMI 22.73 kg/m   Dressing and packing removed. Area cleaned area packed, and gauze and abd pad applied. Zinc Oxide cream applied to outer skin.      Assessment & Plan:  1. Pressure ulcer of sacral region, stage 4 (HCC)  2. Encounter for wound re-check  3. Change or removal of wound dressing    Pt has follow up appt Friday and I have ordered home health and discussed it will $25 per Home Health vs $10 for office visit. Pt states she she is fine with Home Health. Pt can not get into wound care until 10/17/15. Discussed importance of changing positions every 1 1/2 hours -2 hours.  Continue high protein diet Keep follow up appt  Jannifer Rodney, FNP

## 2015-10-06 DIAGNOSIS — L89154 Pressure ulcer of sacral region, stage 4: Secondary | ICD-10-CM | POA: Diagnosis not present

## 2015-10-06 DIAGNOSIS — M1990 Unspecified osteoarthritis, unspecified site: Secondary | ICD-10-CM | POA: Diagnosis not present

## 2015-10-07 ENCOUNTER — Encounter: Payer: Self-pay | Admitting: Family

## 2015-10-07 ENCOUNTER — Ambulatory Visit (INDEPENDENT_AMBULATORY_CARE_PROVIDER_SITE_OTHER): Payer: PPO | Admitting: Family

## 2015-10-07 VITALS — BP 113/69 | HR 95 | Temp 97.7°F | Ht 60.0 in | Wt 119.0 lb

## 2015-10-07 DIAGNOSIS — Z4800 Encounter for change or removal of nonsurgical wound dressing: Secondary | ICD-10-CM | POA: Diagnosis not present

## 2015-10-07 DIAGNOSIS — Z5189 Encounter for other specified aftercare: Secondary | ICD-10-CM

## 2015-10-07 DIAGNOSIS — L89154 Pressure ulcer of sacral region, stage 4: Secondary | ICD-10-CM

## 2015-10-07 NOTE — Patient Instructions (Signed)
Skin Ulcer  A skin ulcer is an open sore that can be shallow or deep. Skin ulcers sometimes become infected and are difficult to treat. It may be 1 month or longer before real healing progress is made.  CAUSES   · Injury.  · Problems with the veins or arteries.  · Diabetes.  · Insect bites.  · Bedsores.  · Inflammatory conditions.  SYMPTOMS   · Pain, redness, swelling, and tenderness around the ulcer.  · Fever.  · Bleeding from the ulcer.  · Yellow or clear fluid coming from the ulcer.  DIAGNOSIS   There are many types of skin ulcers. Any open sores will be examined. Certain tests will be done to determine the kind of ulcer you have. The right treatment depends on the type of ulcer you have.  TREATMENT   Treatment is a long-term challenge. It may include:  · Wearing an elastic wrap, compression stockings, or gel cast over the ulcer area.  · Taking antibiotic medicines or putting antibiotic creams on the affected area if there is an infection.  HOME CARE INSTRUCTIONS  · Put on your bandages (dressings), wraps, or casts over the ulcer as directed by your caregiver.  · Change all dressings as directed by your caregiver.  · Take all medicines as directed by your caregiver.  · Keep the affected area clean and dry.  · Avoid injuries to the affected area.  · Eat a well-balanced, healthy diet that includes plenty of fruit and vegetables.  · If you smoke, consider quitting or decreasing the amount of cigarettes you smoke.  · Once the ulcer heals, get regular exercise as directed by your caregiver.  · Work with your caregiver to make sure your blood pressure, cholesterol, and diabetes are well-controlled.  · Keep your skin moisturized. Dry skin can crack and lead to skin ulcers.  SEEK IMMEDIATE MEDICAL CARE IF:   · Your pain gets worse.  · You have swelling, redness, or fluids around the ulcer.  · You have chills.  · You have a fever.  MAKE SURE YOU:   · Understand these instructions.  · Will watch your condition.  · Will get  help right away if you are not doing well or get worse.     This information is not intended to replace advice given to you by your health care provider. Make sure you discuss any questions you have with your health care provider.     Document Released: 03/15/2004 Document Revised: 04/30/2011 Document Reviewed: 09/22/2010  Elsevier Interactive Patient Education ©2016 [employer] Inc[employer]

## 2015-10-07 NOTE — Progress Notes (Signed)
   Subjective:    Patient ID: Denise [place], female    DOB: [birth year], 75 y.o.   MRN: [id number]  Wound Check  She was originally treated more than 14 days ago. Previous treatment included wound cleansing or irrigation. The maximum temperature noted was less than 100.4 F. There has been colored discharge from the wound. The redness has improved. There is no swelling present. The pain has improved.   Pt presents to the office today for dressing change and wound check. Pt was seen on 09/27/15 and was diagnosed with a sacrum stage 4 pressure ulcer.  Pt can not get into wound care until 10/17/15. Pt has home health come yesterday and change dressing yesterday. Today, pt reports her pain is improved and states she currently she is not having any pain at this time. PT states that part of the dressing "came out last night", but is doing well. Pt reports getting up and moving more and changing positions every 2 hours and eating a high protein diet.    Review of Systems  HENT: Negative for postnasal drip and rhinorrhea.   Respiratory: Negative for shortness of breath.   Cardiovascular: Negative for chest pain and palpitations.  Gastrointestinal: Negative for abdominal distention, anal bleeding, blood in stool and constipation.  Musculoskeletal: Positive for arthralgias.  Skin: Positive for wound.  Psychiatric/Behavioral: Negative.        Objective:   Physical Exam  Constitutional: She is oriented to person, place, and time. She appears well-developed and well-nourished. No distress.  Cardiovascular: Normal rate, regular rhythm, normal heart sounds and intact distal pulses.   No murmur heard. Pulmonary/Chest: Effort normal and breath sounds normal. No respiratory distress.  Musculoskeletal: Normal range of motion.  Neurological: She is alert and oriented to person, place, and time.  Skin: Skin is warm and dry.  Stage 4 pressure ulcer present on sacrum area. Wound tunneling 2.1cm upper part of  wound and approx 4cmx 4cm. purulent drainage present. Skin around greatly improved! Slight erythemas, but much better today!!  Psychiatric: She has a normal mood and affect. Her behavior is normal. Judgment and thought content normal.  Vitals reviewed.   BP 113/69   Pulse 95   Temp 97.7 F (36.5 C) (Oral)   Ht 5' (1.524 m)   Wt 119 lb (54 kg)   BMI 23.24 kg/m   Dressing and packing removed. Area cleaned area packed, and gauze and abd pad applied. Paper tape used     Assessment & Plan:  1. Pressure ulcer of sacral region, stage 4 (HCC)  2. Encounter for wound re-check  3. Change or removal of wound dressing  Pt's daughter is going to do dressing change Sunday and Home Health will be coming on Tuesday. Pt is planning on using Home Health at this time until her appt with wound care on 10/17/15 Discussed importance of changing positions every 1 1/2 hours -2 hours.  Continue high protein diet Keep follow up appt  Jannifer Rodney, FNP

## 2015-10-11 DIAGNOSIS — L89154 Pressure ulcer of sacral region, stage 4: Secondary | ICD-10-CM | POA: Diagnosis not present

## 2015-10-11 DIAGNOSIS — M1990 Unspecified osteoarthritis, unspecified site: Secondary | ICD-10-CM | POA: Diagnosis not present

## 2015-10-17 ENCOUNTER — Encounter (HOSPITAL_BASED_OUTPATIENT_CLINIC_OR_DEPARTMENT_OTHER): Payer: PPO | Attending: Internal Medicine

## 2015-10-17 DIAGNOSIS — L89154 Pressure ulcer of sacral region, stage 4: Secondary | ICD-10-CM | POA: Diagnosis not present

## 2015-10-17 DIAGNOSIS — M0579 Rheumatoid arthritis with rheumatoid factor of multiple sites without organ or systems involvement: Secondary | ICD-10-CM | POA: Diagnosis not present

## 2015-10-19 DIAGNOSIS — L89159 Pressure ulcer of sacral region, unspecified stage: Secondary | ICD-10-CM | POA: Diagnosis not present

## 2015-10-19 DIAGNOSIS — M1990 Unspecified osteoarthritis, unspecified site: Secondary | ICD-10-CM | POA: Diagnosis not present

## 2015-10-19 DIAGNOSIS — L89154 Pressure ulcer of sacral region, stage 4: Secondary | ICD-10-CM | POA: Diagnosis not present

## 2015-10-21 DIAGNOSIS — L89154 Pressure ulcer of sacral region, stage 4: Secondary | ICD-10-CM | POA: Diagnosis not present

## 2015-10-21 DIAGNOSIS — M1990 Unspecified osteoarthritis, unspecified site: Secondary | ICD-10-CM | POA: Diagnosis not present

## 2015-11-04 ENCOUNTER — Encounter (HOSPITAL_BASED_OUTPATIENT_CLINIC_OR_DEPARTMENT_OTHER): Payer: PPO | Attending: Internal Medicine

## 2015-11-04 DIAGNOSIS — M0579 Rheumatoid arthritis with rheumatoid factor of multiple sites without organ or systems involvement: Secondary | ICD-10-CM | POA: Insufficient documentation

## 2015-11-04 DIAGNOSIS — L89154 Pressure ulcer of sacral region, stage 4: Secondary | ICD-10-CM | POA: Diagnosis not present

## 2015-11-09 DIAGNOSIS — L89154 Pressure ulcer of sacral region, stage 4: Secondary | ICD-10-CM | POA: Diagnosis not present

## 2015-11-09 DIAGNOSIS — M1990 Unspecified osteoarthritis, unspecified site: Secondary | ICD-10-CM | POA: Diagnosis not present

## 2015-11-16 DIAGNOSIS — L89154 Pressure ulcer of sacral region, stage 4: Secondary | ICD-10-CM | POA: Diagnosis not present

## 2015-11-16 DIAGNOSIS — M1990 Unspecified osteoarthritis, unspecified site: Secondary | ICD-10-CM | POA: Diagnosis not present

## 2015-11-18 DIAGNOSIS — L89154 Pressure ulcer of sacral region, stage 4: Secondary | ICD-10-CM | POA: Diagnosis not present

## 2015-11-23 DIAGNOSIS — M1990 Unspecified osteoarthritis, unspecified site: Secondary | ICD-10-CM | POA: Diagnosis not present

## 2015-11-23 DIAGNOSIS — L89154 Pressure ulcer of sacral region, stage 4: Secondary | ICD-10-CM | POA: Diagnosis not present

## 2015-12-01 ENCOUNTER — Encounter (HOSPITAL_BASED_OUTPATIENT_CLINIC_OR_DEPARTMENT_OTHER): Payer: PPO | Attending: Internal Medicine

## 2015-12-01 DIAGNOSIS — L97811 Non-pressure chronic ulcer of other part of right lower leg limited to breakdown of skin: Secondary | ICD-10-CM | POA: Diagnosis not present

## 2015-12-01 DIAGNOSIS — M069 Rheumatoid arthritis, unspecified: Secondary | ICD-10-CM | POA: Insufficient documentation

## 2015-12-01 DIAGNOSIS — L89154 Pressure ulcer of sacral region, stage 4: Secondary | ICD-10-CM | POA: Diagnosis not present

## 2015-12-07 DIAGNOSIS — M1990 Unspecified osteoarthritis, unspecified site: Secondary | ICD-10-CM | POA: Diagnosis not present

## 2015-12-07 DIAGNOSIS — L89154 Pressure ulcer of sacral region, stage 4: Secondary | ICD-10-CM | POA: Diagnosis not present

## 2015-12-12 ENCOUNTER — Other Ambulatory Visit: Payer: Self-pay | Admitting: Family

## 2015-12-12 DIAGNOSIS — L89154 Pressure ulcer of sacral region, stage 4: Secondary | ICD-10-CM | POA: Diagnosis not present

## 2015-12-12 NOTE — Telephone Encounter (Signed)
Last filled 11/14/15, last seen 10/07/15. Call in

## 2015-12-12 NOTE — Telephone Encounter (Signed)
Xanax called to pharmacy. 

## 2015-12-16 DIAGNOSIS — L89154 Pressure ulcer of sacral region, stage 4: Secondary | ICD-10-CM | POA: Diagnosis not present

## 2015-12-20 ENCOUNTER — Ambulatory Visit (INDEPENDENT_AMBULATORY_CARE_PROVIDER_SITE_OTHER): Payer: PPO | Admitting: Family Medicine

## 2015-12-20 DIAGNOSIS — M1990 Unspecified osteoarthritis, unspecified site: Secondary | ICD-10-CM | POA: Diagnosis not present

## 2015-12-20 DIAGNOSIS — L89154 Pressure ulcer of sacral region, stage 4: Secondary | ICD-10-CM

## 2015-12-21 DIAGNOSIS — L89154 Pressure ulcer of sacral region, stage 4: Secondary | ICD-10-CM | POA: Diagnosis not present

## 2015-12-21 DIAGNOSIS — M1990 Unspecified osteoarthritis, unspecified site: Secondary | ICD-10-CM | POA: Diagnosis not present

## 2015-12-23 ENCOUNTER — Encounter (HOSPITAL_BASED_OUTPATIENT_CLINIC_OR_DEPARTMENT_OTHER): Payer: PPO | Attending: Internal Medicine

## 2015-12-23 DIAGNOSIS — M0579 Rheumatoid arthritis with rheumatoid factor of multiple sites without organ or systems involvement: Secondary | ICD-10-CM | POA: Diagnosis not present

## 2015-12-23 DIAGNOSIS — L97211 Non-pressure chronic ulcer of right calf limited to breakdown of skin: Secondary | ICD-10-CM | POA: Diagnosis not present

## 2015-12-23 DIAGNOSIS — L89154 Pressure ulcer of sacral region, stage 4: Secondary | ICD-10-CM | POA: Insufficient documentation

## 2015-12-23 DIAGNOSIS — R21 Rash and other nonspecific skin eruption: Secondary | ICD-10-CM | POA: Insufficient documentation

## 2015-12-26 ENCOUNTER — Telehealth: Payer: Self-pay | Admitting: Family

## 2015-12-26 NOTE — Telephone Encounter (Signed)
Pt notified she is up to date on Pneumonia vaccines

## 2015-12-27 DIAGNOSIS — M0579 Rheumatoid arthritis with rheumatoid factor of multiple sites without organ or systems involvement: Secondary | ICD-10-CM | POA: Diagnosis not present

## 2015-12-27 DIAGNOSIS — T148XXD Other injury of unspecified body region, subsequent encounter: Secondary | ICD-10-CM | POA: Diagnosis not present

## 2015-12-27 DIAGNOSIS — Z79899 Other long term (current) drug therapy: Secondary | ICD-10-CM | POA: Diagnosis not present

## 2015-12-27 DIAGNOSIS — M2550 Pain in unspecified joint: Secondary | ICD-10-CM | POA: Diagnosis not present

## 2015-12-30 DIAGNOSIS — S81801A Unspecified open wound, right lower leg, initial encounter: Secondary | ICD-10-CM | POA: Diagnosis not present

## 2015-12-30 DIAGNOSIS — L89154 Pressure ulcer of sacral region, stage 4: Secondary | ICD-10-CM | POA: Diagnosis not present

## 2016-01-04 DIAGNOSIS — L89154 Pressure ulcer of sacral region, stage 4: Secondary | ICD-10-CM | POA: Diagnosis not present

## 2016-01-04 DIAGNOSIS — M1990 Unspecified osteoarthritis, unspecified site: Secondary | ICD-10-CM | POA: Diagnosis not present

## 2016-01-11 ENCOUNTER — Encounter: Payer: Self-pay | Admitting: Family

## 2016-01-11 DIAGNOSIS — L89154 Pressure ulcer of sacral region, stage 4: Secondary | ICD-10-CM | POA: Diagnosis not present

## 2016-01-11 DIAGNOSIS — M1990 Unspecified osteoarthritis, unspecified site: Secondary | ICD-10-CM | POA: Diagnosis not present

## 2016-01-20 ENCOUNTER — Encounter (HOSPITAL_BASED_OUTPATIENT_CLINIC_OR_DEPARTMENT_OTHER): Payer: PPO | Attending: Internal Medicine

## 2016-01-20 DIAGNOSIS — L97211 Non-pressure chronic ulcer of right calf limited to breakdown of skin: Secondary | ICD-10-CM | POA: Diagnosis not present

## 2016-01-20 DIAGNOSIS — M0579 Rheumatoid arthritis with rheumatoid factor of multiple sites without organ or systems involvement: Secondary | ICD-10-CM | POA: Insufficient documentation

## 2016-01-20 DIAGNOSIS — L89154 Pressure ulcer of sacral region, stage 4: Secondary | ICD-10-CM | POA: Diagnosis not present

## 2016-01-24 ENCOUNTER — Encounter: Payer: Self-pay | Admitting: Family

## 2016-01-24 ENCOUNTER — Ambulatory Visit (INDEPENDENT_AMBULATORY_CARE_PROVIDER_SITE_OTHER): Payer: PPO | Admitting: Family

## 2016-01-24 VITALS — BP 117/74 | HR 100 | Temp 97.5°F | Ht 60.0 in | Wt 113.2 lb

## 2016-01-24 DIAGNOSIS — M810 Age-related osteoporosis without current pathological fracture: Secondary | ICD-10-CM | POA: Diagnosis not present

## 2016-01-24 DIAGNOSIS — E785 Hyperlipidemia, unspecified: Secondary | ICD-10-CM | POA: Diagnosis not present

## 2016-01-24 DIAGNOSIS — F411 Generalized anxiety disorder: Secondary | ICD-10-CM | POA: Diagnosis not present

## 2016-01-24 DIAGNOSIS — G4700 Insomnia, unspecified: Secondary | ICD-10-CM | POA: Diagnosis not present

## 2016-01-24 DIAGNOSIS — M0579 Rheumatoid arthritis with rheumatoid factor of multiple sites without organ or systems involvement: Secondary | ICD-10-CM | POA: Diagnosis not present

## 2016-01-24 MED ORDER — AMITRIPTYLINE HCL 25 MG PO TABS: ORAL_TABLET | ORAL | 3 refills | Status: AC

## 2016-01-24 MED ORDER — ALPRAZOLAM 0.5 MG PO TABS: ORAL_TABLET | ORAL | 2 refills | Status: DC

## 2016-01-24 MED ORDER — TRAMADOL HCL 50 MG PO TABS: 50.0000 mg | ORAL_TABLET | Freq: Three times a day (TID) | ORAL | 2 refills | Status: AC | PRN

## 2016-01-24 MED ORDER — ALPRAZOLAM 0.5 MG PO TABS: ORAL_TABLET | ORAL | 2 refills | Status: AC

## 2016-01-24 NOTE — Progress Notes (Signed)
Subjective:    Patient ID: Denise Webster, female    DOB: 1940-04-10, 75 y.o.   MRN: [id number]  Pt presents to the office today for chronic follow up. Pt has Rheumatoid arthritis who she see's a rheumatologists every three months.  Anxiety  Presents for follow-up visit. Symptoms include insomnia. Patient reports no depressed mood, excessive worry, nervous/anxious behavior, palpitations, panic or shortness of breath. Symptoms occur occasionally.    Hyperlipidemia  This is a chronic problem. The current episode started more than 1 year ago. The problem is controlled. Recent lipid tests were reviewed and are normal. She has no history of diabetes. Pertinent negatives include no leg pain or shortness of breath. Current antihyperlipidemic treatment includes diet change. The current treatment provides mild improvement of lipids. Risk factors for coronary artery disease include dyslipidemia, hypertension, post-menopausal and a sedentary lifestyle.  Arthritis  Presents for follow-up visit. The disease course has been worsening. She complains of pain and joint swelling. Affected locations include the right MCP, left MCP, right knee and left knee. Her pain is at a severity of 4/10. Associated symptoms include pain while resting. Pertinent negatives include no pain at night. Her past medical history is significant for osteoarthritis and rheumatoid arthritis. Past treatments include heat, activity, an opioid and rest.  Insomnia  Primary symptoms: difficulty falling asleep.  The current episode started more than one year. The onset quality is gradual. The problem occurs rarely. The problem has been waxing and waning since onset. Past treatments include medication. The treatment provided moderate relief.  Osteoporosis PT states she stopped her Fosamax because it was causing her to "hurt". PT states she tries to do weight bearing exercises.     Review of Systems  Constitutional: Negative.   HENT:  Negative.   Eyes: Negative.   Respiratory: Negative.  Negative for shortness of breath.   Cardiovascular: Negative.  Negative for palpitations.  Gastrointestinal: Negative.   Endocrine: Negative.   Genitourinary: Negative.   Musculoskeletal: Positive for arthritis and joint swelling.  Neurological: Negative.  Negative for headaches.  Hematological: Negative.   Psychiatric/Behavioral: The patient has insomnia. The patient is not nervous/anxious.   All other systems reviewed and are negative.      Objective:   Physical Exam  Constitutional: She is oriented to person, place, and time. She appears well-developed and well-nourished. No distress.  HENT:  Head: Normocephalic and atraumatic.  Right Ear: External ear normal.  Left Ear: External ear normal.  Nose: Nose normal.  Mouth/Throat: Oropharynx is clear and moist.  Eyes: Pupils are equal, round, and reactive to light.  Neck: Normal range of motion. Neck supple. No thyromegaly present.  Cardiovascular: Normal rate, regular rhythm, normal heart sounds and intact distal pulses.   No murmur heard. Pulmonary/Chest: Effort normal and breath sounds normal. No respiratory distress. She has no wheezes.  Abdominal: Soft. Bowel sounds are normal. She exhibits no distension. There is no tenderness.  Musculoskeletal: Normal range of motion. She exhibits no edema or tenderness.  Neurological: She is alert and oriented to person, place, and time. She has normal reflexes. No cranial nerve deficit.  Skin: Skin is warm and dry.  Pressure ulcer with dressing present  Psychiatric: She has a normal mood and affect. Her behavior is normal. Judgment and thought content normal.  Vitals reviewed.   BP 117/74   Pulse 100   Temp 97.5 F (36.4 C) (Oral)   Ht 5' (1.524 m)   Wt 113 lb 3.2 oz (51.3 kg)  BMI 22.11 kg/m        Assessment & Plan:  1. Rheumatoid arthritis involving multiple sites with positive rheumatoid factor (HCC) - traMADol  (ULTRAM) 50 MG tablet; Take 1 tablet (50 mg total) by mouth 3 (three) times daily as needed.  Dispense: 90 tablet; Refill: 2 - CMP14+EGFR  2. GAD (generalized anxiety disorder) - ALPRAZolam (XANAX) 0.5 MG tablet; TAKE  (1)  TABLET  THREE TIMES DAILY AS NEEDED.  Dispense: 90 tablet; Refill: 2 - amitriptyline (ELAVIL) 25 MG tablet; TAKE ONE TABLET BY MOUTH AT BEDTIME  Dispense: 90 tablet; Refill: 3 - CMP14+EGFR  3. Hyperlipidemia with target LDL less than 100 - CMP14+EGFR - Lipid panel  4. Insomnia, unspecified type - amitriptyline (ELAVIL) 25 MG tablet; TAKE ONE TABLET BY MOUTH AT BEDTIME  Dispense: 90 tablet; Refill: 3 - CMP14+EGFR  5. Osteoporosis, unspecified osteoporosis type, unspecified pathological fracture presence - CMP14+EGFR   Continue all meds Labs pending Health Maintenance reviewed Diet and exercise encouraged RTO 6 months   Evelina Dun, FNP

## 2016-01-24 NOTE — Patient Instructions (Signed)

## 2016-01-25 DIAGNOSIS — M1990 Unspecified osteoarthritis, unspecified site: Secondary | ICD-10-CM | POA: Diagnosis not present

## 2016-01-25 DIAGNOSIS — L89154 Pressure ulcer of sacral region, stage 4: Secondary | ICD-10-CM | POA: Diagnosis not present

## 2016-01-25 LAB — CMP14+EGFR
A/G RATIO: 1.8 (ref 1.2–2.2)
ALBUMIN: 4.5 g/dL (ref 3.5–4.8)
ALT: 23 IU/L (ref 0–32)
AST: 25 IU/L (ref 0–40)
Alkaline Phosphatase: 44 IU/L (ref 39–117)
BUN/Creatinine Ratio: 21 (ref 12–28)
BUN: 14 mg/dL (ref 8–27)
Bilirubin Total: 0.5 mg/dL (ref 0.0–1.2)
CO2: 26 mmol/L (ref 18–29)
CREATININE: 0.67 mg/dL (ref 0.57–1.00)
Calcium: 9.3 mg/dL (ref 8.7–10.3)
Chloride: 97 mmol/L (ref 96–106)
GFR, EST AFRICAN AMERICAN: 98 mL/min/{1.73_m2} (ref >59)
GFR, EST NON AFRICAN AMERICAN: 85 mL/min/{1.73_m2} (ref >59)
GLOBULIN, TOTAL: 2.5 g/dL (ref 1.5–4.5)
Glucose: 86 mg/dL (ref 65–99)
POTASSIUM: 4.1 mmol/L (ref 3.5–5.2)
SODIUM: 140 mmol/L (ref 134–144)
TOTAL PROTEIN: 7 g/dL (ref 6.0–8.5)

## 2016-01-25 LAB — LIPID PANEL
CHOL/HDL RATIO: 2 ratio (ref 0.0–4.4)
Cholesterol, Total: 202 mg/dL — ABNORMAL HIGH (ref 100–199)
HDL: 101 mg/dL (ref >39)
LDL CALC: 91 mg/dL (ref 0–99)
TRIGLYCERIDES: 51 mg/dL (ref 0–149)
VLDL Cholesterol Cal: 10 mg/dL (ref 5–40)

## 2016-02-02 DIAGNOSIS — L89154 Pressure ulcer of sacral region, stage 4: Secondary | ICD-10-CM | POA: Diagnosis not present

## 2016-02-02 DIAGNOSIS — L97211 Non-pressure chronic ulcer of right calf limited to breakdown of skin: Secondary | ICD-10-CM | POA: Diagnosis not present

## 2016-02-08 DIAGNOSIS — L89154 Pressure ulcer of sacral region, stage 4: Secondary | ICD-10-CM | POA: Diagnosis not present

## 2016-02-08 DIAGNOSIS — M1990 Unspecified osteoarthritis, unspecified site: Secondary | ICD-10-CM | POA: Diagnosis not present

## 2016-02-15 DIAGNOSIS — M1990 Unspecified osteoarthritis, unspecified site: Secondary | ICD-10-CM | POA: Diagnosis not present

## 2016-02-15 DIAGNOSIS — L89154 Pressure ulcer of sacral region, stage 4: Secondary | ICD-10-CM | POA: Diagnosis not present

## 2016-02-17 DIAGNOSIS — L89154 Pressure ulcer of sacral region, stage 4: Secondary | ICD-10-CM | POA: Diagnosis not present

## 2016-02-17 DIAGNOSIS — L97211 Non-pressure chronic ulcer of right calf limited to breakdown of skin: Secondary | ICD-10-CM | POA: Diagnosis not present

## 2016-02-21 ENCOUNTER — Emergency Department (HOSPITAL_COMMUNITY): Payer: PPO

## 2016-02-21 ENCOUNTER — Emergency Department (HOSPITAL_COMMUNITY)
Admission: EM | Admit: 2016-02-21 | Discharge: 2016-03-22 | Disposition: E | Payer: PPO | Attending: Emergency Medicine | Admitting: Emergency Medicine

## 2016-02-21 DIAGNOSIS — R0602 Shortness of breath: Secondary | ICD-10-CM | POA: Diagnosis not present

## 2016-02-21 DIAGNOSIS — R05 Cough: Secondary | ICD-10-CM | POA: Diagnosis not present

## 2016-02-21 DIAGNOSIS — J189 Pneumonia, unspecified organism: Secondary | ICD-10-CM | POA: Diagnosis not present

## 2016-02-21 DIAGNOSIS — R4182 Altered mental status, unspecified: Secondary | ICD-10-CM | POA: Insufficient documentation

## 2016-02-21 DIAGNOSIS — R404 Transient alteration of awareness: Secondary | ICD-10-CM | POA: Diagnosis not present

## 2016-02-21 DIAGNOSIS — Z79899 Other long term (current) drug therapy: Secondary | ICD-10-CM | POA: Diagnosis not present

## 2016-02-21 DIAGNOSIS — R059 Cough, unspecified: Secondary | ICD-10-CM

## 2016-02-21 DIAGNOSIS — R509 Fever, unspecified: Secondary | ICD-10-CM | POA: Diagnosis not present

## 2016-02-21 DIAGNOSIS — R061 Stridor: Secondary | ICD-10-CM | POA: Diagnosis not present

## 2016-02-21 LAB — CBC WITH DIFFERENTIAL/PLATELET
Basophils Absolute: 0.1 10*3/uL (ref 0.0–0.1)
Basophils Relative: 0 %
EOS PCT: 0 %
Eosinophils Absolute: 0.1 10*3/uL (ref 0.0–0.7)
HCT: 35 % — ABNORMAL LOW (ref 36.0–46.0)
HEMOGLOBIN: 11.7 g/dL — AB (ref 12.0–15.0)
LYMPHS ABS: 1.1 10*3/uL (ref 0.7–4.0)
LYMPHS PCT: 7 %
MCH: 33.5 pg (ref 26.0–34.0)
MCHC: 33.4 g/dL (ref 30.0–36.0)
MCV: 100.3 fL — AB (ref 78.0–100.0)
Monocytes Absolute: 0.4 10*3/uL (ref 0.1–1.0)
Monocytes Relative: 2 %
NEUTROS PCT: 91 %
Neutro Abs: 15.5 10*3/uL — ABNORMAL HIGH (ref 1.7–7.7)
Platelets: 108 10*3/uL — ABNORMAL LOW (ref 150–400)
RBC: 3.49 MIL/uL — AB (ref 3.87–5.11)
RDW: 15 % (ref 11.5–15.5)
WBC: 17.1 10*3/uL — ABNORMAL HIGH (ref 4.0–10.5)

## 2016-02-21 LAB — I-STAT CHEM 8, ED
BUN: 14 mg/dL (ref 6–20)
CALCIUM ION: 0.84 mmol/L — AB (ref 1.15–1.40)
Chloride: 96 mmol/L — ABNORMAL LOW (ref 101–111)
Creatinine, Ser: 0.7 mg/dL (ref 0.44–1.00)
Glucose, Bld: 104 mg/dL — ABNORMAL HIGH (ref 65–99)
HEMATOCRIT: 35 % — AB (ref 36.0–46.0)
Hemoglobin: 11.9 g/dL — ABNORMAL LOW (ref 12.0–15.0)
Potassium: 4 mmol/L (ref 3.5–5.1)
SODIUM: 131 mmol/L — AB (ref 135–145)
TCO2: 25 mmol/L (ref 0–100)

## 2016-02-21 LAB — URINALYSIS, ROUTINE W REFLEX MICROSCOPIC
Bilirubin Urine: NEGATIVE
Glucose, UA: NEGATIVE mg/dL
HGB URINE DIPSTICK: NEGATIVE
Leukocytes, UA: NEGATIVE
Nitrite: NEGATIVE
PROTEIN: 30 mg/dL — AB
Specific Gravity, Urine: 1.025 (ref 1.005–1.030)
pH: 6 (ref 5.0–8.0)

## 2016-02-21 LAB — COMPREHENSIVE METABOLIC PANEL
ALBUMIN: 2.5 g/dL — AB (ref 3.5–5.0)
ALT: 31 U/L (ref 14–54)
AST: 90 U/L — AB (ref 15–41)
Alkaline Phosphatase: 72 U/L (ref 38–126)
Anion gap: 11 (ref 5–15)
BUN: 14 mg/dL (ref 6–20)
CHLORIDE: 95 mmol/L — AB (ref 101–111)
CO2: 25 mmol/L (ref 22–32)
Calcium: 8.1 mg/dL — ABNORMAL LOW (ref 8.9–10.3)
Creatinine, Ser: 0.71 mg/dL (ref 0.44–1.00)
GFR calc Af Amer: >60 mL/min (ref >60)
Glucose, Bld: 111 mg/dL — ABNORMAL HIGH (ref 65–99)
POTASSIUM: 3.9 mmol/L (ref 3.5–5.1)
SODIUM: 131 mmol/L — AB (ref 135–145)
Total Bilirubin: 1.1 mg/dL (ref 0.3–1.2)
Total Protein: 5.7 g/dL — ABNORMAL LOW (ref 6.5–8.1)

## 2016-02-21 LAB — TROPONIN I: Troponin I: 9.41 ng/mL (ref <0.03)

## 2016-02-21 LAB — PROTIME-INR
INR: 1.31
PROTHROMBIN TIME: 16.4 s — AB (ref 11.4–15.2)

## 2016-02-21 LAB — URINALYSIS, MICROSCOPIC (REFLEX): Squamous Epithelial / LPF: NONE SEEN

## 2016-02-21 LAB — I-STAT TROPONIN, ED: TROPONIN I, POC: 9.12 ng/mL — AB (ref 0.00–0.08)

## 2016-02-21 LAB — APTT: aPTT: 26 seconds (ref 24–36)

## 2016-02-21 LAB — I-STAT CG4 LACTIC ACID, ED: LACTIC ACID, VENOUS: 3.69 mmol/L — AB (ref 0.5–1.9)

## 2016-02-21 LAB — LACTIC ACID, PLASMA: Lactic Acid, Venous: 3 mmol/L (ref 0.5–1.9)

## 2016-02-21 LAB — CBG MONITORING, ED: GLUCOSE-CAPILLARY: 78 mg/dL (ref 65–99)

## 2016-02-21 MED ORDER — ACETAMINOPHEN 650 MG RE SUPP
650.0000 mg | Freq: Once | RECTAL | Status: AC
  Administered 2016-02-21: 650 mg via RECTAL
  Filled 2016-02-21: qty 1

## 2016-02-21 MED ORDER — MORPHINE SULFATE (PF) 2 MG/ML IV SOLN
2.0000 mg | Freq: Once | INTRAVENOUS | Status: AC
  Administered 2016-02-21: 2 mg via INTRAVENOUS

## 2016-02-21 MED ORDER — DEXTROSE 50 % IV SOLN
1.0000 | Freq: Once | INTRAVENOUS | Status: AC
  Administered 2016-02-21: 50 mL via INTRAVENOUS

## 2016-02-21 MED ORDER — DEXTROSE 50 % IV SOLN
INTRAVENOUS | Status: AC
  Filled 2016-02-21: qty 50

## 2016-02-21 MED ORDER — DEXTROSE 5 % IV SOLN
500.0000 mg | INTRAVENOUS | Status: DC
  Administered 2016-02-21: 500 mg via INTRAVENOUS
  Filled 2016-02-21 (×2): qty 500

## 2016-02-21 MED ORDER — SODIUM CHLORIDE 0.9 % IV BOLUS (SEPSIS)
1000.0000 mL | Freq: Once | INTRAVENOUS | Status: AC
  Administered 2016-02-21: 1000 mL via INTRAVENOUS

## 2016-02-21 MED ORDER — MORPHINE SULFATE (PF) 2 MG/ML IV SOLN
INTRAVENOUS | Status: AC
  Administered 2016-02-21: 2 mg via INTRAVENOUS
  Filled 2016-02-21: qty 1

## 2016-02-21 MED ORDER — DEXTROSE 5 % IV SOLN
1.0000 g | Freq: Once | INTRAVENOUS | Status: AC
  Administered 2016-02-21: 1 g via INTRAVENOUS
  Filled 2016-02-21: qty 10

## 2016-02-22 ENCOUNTER — Other Ambulatory Visit: Payer: Self-pay | Admitting: Family

## 2016-02-22 ENCOUNTER — Encounter (HOSPITAL_COMMUNITY): Payer: Self-pay

## 2016-02-22 LAB — URINE CULTURE: CULTURE: NO GROWTH

## 2016-02-26 LAB — CULTURE, BLOOD (ROUTINE X 2)
CULTURE: NO GROWTH
Culture: NO GROWTH

## 2016-03-22 DIAGNOSIS — 419620001 Death: Secondary | SNOMED CT | POA: Insufficient documentation

## 2016-03-22 NOTE — ED Notes (Signed)
CRITICAL VALUE ALERT  Critical value received:  Lactic acid (lab) 3.0  Date of notification:  March 16, 2016  Time of notification:  0912  Critical value read back:Yes.    Nurse who received alert:  [facility] RN  MD notified (1st page):  Dr. Adriana Simas  Time of first page:  0912  MD notified (2nd page):  Time of second page:  Responding MD:  Dr. Adriana Simas  Time MD responded:  [phone]

## 2016-03-22 NOTE — ED Notes (Signed)
Family states they are ready for body preparation and for funeral home to be notified.

## 2016-03-22 NOTE — ED Notes (Signed)
[employer] representative to ED to obtain patients body. Daughter, Raynelle Fanning, present and aware.

## 2016-03-22 NOTE — ED Notes (Signed)
Pt 02 saturation 84% on 6liters [place]. Pt placed on nonrebreather. EDP aware. No new orders given.

## 2016-03-22 NOTE — ED Notes (Signed)
All tubes and lines removed from patient's body. Body cleansed with soap and water. Placed in body bag with toe tag attached and identity confirmed.

## 2016-03-22 NOTE — ED Triage Notes (Signed)
EMS reports patient found this morning by family lethargic, febrile 101, patient is tachypneic with labored breathing, EMS states patient sick for 3 weeks with cough and taking OTC Delsym - patient also has left buttocks decub and right foot wound. Dr. Adriana Simas called to bedside.

## 2016-03-22 NOTE — ED Notes (Signed)
cbg of 78.

## 2016-03-22 NOTE — ED Provider Notes (Signed)
[facility]EM[facility]Y [facility]PT Provider Note   CSN: [id number] Arrival date & time: 03/10/16  0809  By signing my name below, I, Modena Jansky, attest that this documentation has been prepared under the direction and in the presence of Donnetta Hutching, MD . Electronically Signed: Modena Jansky, Scribe. Mar 10, 2016. 8:09 AM.  History   Chief Complaint Chief Complaint  Patient presents with  . Respiratory Distress   The history is provided by medical records and the EMS personnel. No language interpreter was used.   HPI Comments:Level V caveat secondary to urgent need for intervention Denise Webster is a 76 y.o. female who presents to the Emergency Department complaining of persistent SOB that started just PTA. Pt was brought in by EMS. Per nurse's note, pt has been sick over the past 3 weeks with a cough and has been taking Delysn. She has not been eating and drinking well. She was found by her family this morning with associated fatigue, subjective fever, and labored breathing. Pt's temperature in the ED today was 96.8. Daughter reports a prodromal upper respiratory infection    PCP: Jannifer Rodney, FNP  Past Medical History:  Diagnosis Date  . Anxiety   . Chronic rheumatic arthritis (HCC)   . Hyperlipidemia   . Osteoporosis     Patient Active Problem List   Diagnosis Date Noted  . Osteoporosis 11/30/2014  . Insomnia 08/19/2014  . Rheumatoid arthritis (HCC) 11/05/2012  . GAD (generalized anxiety disorder) 11/05/2012  . 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 Hyperlipidemia with target LDL less than 100 11/05/2012    Past Surgical History:  Procedure Laterality Date  . CHOLECYSTECTOMY      OB History    No data available       Home Medications    Prior to Admission medications   Medication Sig Start Date End Date Taking? Authorizing Provider  ALPRAZolam (XANAX) 0.5 MG tablet TAKE  (1)  TABLET  THREE TIMES DAILY AS NEEDED. 01/24/16   Junie Spencer, FNP  amitriptyline (ELAVIL) 25 MG  tablet TAKE ONE TABLET BY MOUTH AT BEDTIME 01/24/16   Junie Spencer, FNP  calcium carbonate (OS-CAL) 600 MG TABS tablet Take 600 mg by mouth 2 (two) times daily with a meal.    Historical Provider, MD  clobetasol cream (TEMOVATE) 0.05 % APPLY TOPICALLY 2 (TWO) TIMES DAILY. 03/18/13   Coralie Keens, FNP  etanercept (ENBREL) 50 MG/ML injection Inject 50 mg into the skin once a week.    Historical Provider, MD  folic acid (FOLVITE) 1 MG tablet Take 1 mg by mouth 2 (two) times daily.    Historical Provider, MD  hydrocortisone (PROCTOZONE-HC) 2.5 % rectal cream APPLY TO AFFECTED AREA TWICE A DAY TO FOUR TIMES A DAY AS INSTRUCTED 11/30/14   Junie Spencer, FNP  hydroxychloroquine (PLAQUENIL) 200 MG tablet Take by mouth 2 (two) times daily.    Historical Provider, MD  leflunomide (ARAVA) 20 MG tablet Take 20 mg by mouth daily.    Historical Provider, MD  methotrexate (RHEUMATREX) 2.5 MG tablet Take 2.5 mg by mouth once a week. Caution:Chemotherapy. Protect from light.    Historical Provider, MD  predniSONE (DELTASONE) 5 MG tablet Take 5 mg by mouth daily.    Historical Provider, MD  sulfaSALAzine (AZULFIDINE) 500 MG tablet Take 500 mg by mouth 2 (two) times daily.    Historical Provider, MD  traMADol (ULTRAM) 50 MG tablet Take 1 tablet (50 mg total) by mouth 3 (three) times daily as needed. 01/24/16  Junie Spencer, FNP  vitamin B-12 (CYANOCOBALAMIN) 1000 MCG tablet Take 1,000 mcg by mouth daily.    Historical Provider, MD    Family History No family history on file.  Social History Social History  Substance Use Topics  . Smoking status: Never Smoker  . Smokeless tobacco: Never Used  . Alcohol use No     Allergies   Codeine and Fosamax [alendronate sodium]   Review of Systems Review of Systems A complete 10 system review of systems was obtained and all systems are negative except as noted in the HPI and PMH.   Physical Exam Updated Vital Signs BP (!) 161/101   Pulse 87   Temp (!)  96.8 F (36 C) (Core (Comment))   Resp (!) 34   Ht 5\' 4"  (1.626 m)   Wt 120 lb (54.4 kg)   SpO2 92%   BMI 20.60 kg/m   Physical Exam  Constitutional:  obtunded  HENT:  Head: Normocephalic and atraumatic.  Eyes: Conjunctivae are normal.  Neck: Neck supple.  Cardiovascular: Normal rate and regular rhythm.   Pulmonary/Chest: Effort normal and breath sounds normal.  Dyspneic and tachypneic  Abdominal: Soft. Bowel sounds are normal.  Musculoskeletal:  unable  Neurological:  unable  Skin: Skin is warm and dry.  Psychiatric:  obtunded  Nursing note and vitals reviewed.    ED Treatments / Results  DIAGNOSTIC STUDIES: Oxygen Saturation is 92% on RA, normal by my interpretation.    COORDINATION OF CARE: 8:14 AM- Pt advised of plan for treatment, which includes a Head CT, lab workup, and an EKG and pt agrees.  Labs (all labs ordered are listed, but only abnormal results are displayed) Labs Reviewed  COMPREHENSIVE METABOLIC PANEL - Abnormal; Notable for the following:       Result Value   Sodium 131 (*)    Chloride 95 (*)    Glucose, Bld 111 (*)    Calcium 8.1 (*)    Total Protein 5.7 (*)    Albumin 2.5 (*)    AST 90 (*)    All other components within normal limits  CBC WITH DIFFERENTIAL/PLATELET - Abnormal; Notable for the following:    WBC 17.1 (*)    RBC 3.49 (*)    Hemoglobin 11.7 (*)    HCT 35.0 (*)    MCV 100.3 (*)    Platelets 108 (*)    Neutro Abs 15.5 (*)    All other components within normal limits  URINALYSIS, ROUTINE W REFLEX MICROSCOPIC - Abnormal; Notable for the following:    APPearance CLOUDY (*)    Ketones, ur TRACE (*)    Protein, ur 30 (*)    All other components within normal limits  LACTIC ACID, PLASMA - Abnormal; Notable for the following:    Lactic Acid, Venous 3.0 (*)    All other components within normal limits  TROPONIN I - Abnormal; Notable for the following:    Troponin I 9.41 (*)    All other components within normal limits    PROTIME-INR - Abnormal; Notable for the following:    Prothrombin Time 16.4 (*)    All other components within normal limits  URINALYSIS, MICROSCOPIC (REFLEX) - Abnormal; Notable for the following:    Bacteria, UA MANY (*)    All other components within normal limits  I-STAT CG4 LACTIC ACID, ED - Abnormal; Notable for the following:    Lactic Acid, Venous 3.69 (*)    All other components within normal limits  I-STAT  TROPOININ, ED - Abnormal; Notable for the following:    Troponin i, poc 9.12 (*)    All other components within normal limits  I-STAT CHEM 8, ED - Abnormal; Notable for the following:    Sodium 131 (*)    Chloride 96 (*)    Glucose, Bld 104 (*)    Calcium, Ion 0.84 (*)    Hemoglobin 11.9 (*)    HCT 35.0 (*)    All other components within normal limits  CULTURE, BLOOD (ROUTINE X 2)  CULTURE, BLOOD (ROUTINE X 2)  URINE CULTURE  APTT  LACTIC ACID, PLASMA  CBG MONITORING, ED  I-STAT CG4 LACTIC ACID, ED    EKG  EKG Interpretation  Date/Time:  Tuesday February 24, 2016 08:26:19 EST Ventricular Rate:  94 PR Interval:    QRS Duration: 100 QT Interval:  366 QTC Calculation: 461 R Axis:   17 Text Interpretation:  Sinus rhythm Inferior infarct, acute (LCx) Confirmed by Berneice Zettlemoyer  MD, Jeanae Whitmill ([id number]) on February 24, 2016 10:06:54 AM       Radiology Ct Head Wo Contrast  Result Date: Feb 24, 2016 CLINICAL DATA:  Altered mental status EXAM: CT HEAD WITHOUT CONTRAST TECHNIQUE: Contiguous axial images were obtained from the base of the skull through the vertex without intravenous contrast. COMPARISON:  None. FINDINGS: Brain: No acute intracranial abnormality. Specifically, no hemorrhage, hydrocephalus, mass lesion, acute infarction, or significant intracranial injury. Vascular: No hyperdense vessel or unexpected calcification. Skull: No acute calvarial abnormality. Sinuses/Orbits: Visualized paranasal sinuses and mastoids clear. Orbital soft tissues unremarkable. Other: None IMPRESSION: No  acute intracranial abnormality. Electronically Signed   By: Charlett Nose M.D.   On: 2016/02/24 08:59   Dg Chest Port 1 View  Result Date: 02-24-2016 CLINICAL DATA:  76 year old female with cough for 3 weeks, found by family febrile and lethargic with labored breathing. Respiratory distress. Initial encounter. EXAM: PORTABLE CHEST 1 VIEW COMPARISON:  Chest radiographs 05/31/2009. FINDINGS: Portable AP upright view at 0906 hours. Confluent bilateral upper lobe and left greater than right lower lobe airspace opacity is new since 2011. Lung volumes appear stable. Mediastinal contours appear stable and within normal limits. Visualized tracheal air column is within normal limits. No pneumothorax or pleural effusion identified. Stable cholecystectomy clips. No acute osseous abnormality identified. IMPRESSION: Confluent bilateral pulmonary opacity most suspicious for bilateral multilobar pneumonia in this clinical setting. Consider also aspiration. No pleural effusion identified. Electronically Signed   By: Odessa Fleming M.D.   On: 02-24-16 09:27    Procedures Procedures (including critical care time)  Medications Ordered in ED Medications  azithromycin (ZITHROMAX) 500 mg in dextrose 5 % 250 mL IVPB (0 mg Intravenous Stopped 2016/02/24 1030)  sodium chloride 0.9 % bolus 1,000 mL (0 mLs Intravenous Stopped 02-24-16 0958)  sodium chloride 0.9 % bolus 1,000 mL (0 mLs Intravenous Stopped 02/24/16 0958)  acetaminophen (TYLENOL) suppository 650 mg (650 mg Rectal Given 02-24-2016 0930)  cefTRIAXone (ROCEPHIN) 1 g in dextrose 5 % 50 mL IVPB (0 g Intravenous Stopped Feb 24, 2016 0958)  dextrose 50 % solution 50 mL (50 mLs Intravenous Given by Other February 24, 2016 0937)  morphine 2 MG/ML injection 2 mg (2 mg Intravenous Given 02/24/16 0930)     Initial Impression / Assessment and Plan / ED Course  I have reviewed the triage vital signs and the nursing notes.  Pertinent labs & imaging results that were available during my care of the patient  were reviewed by me and considered in my medical decision making (see chart for details).  CRITICAL CARE Performed by: Donnetta Hutching Total critical care time: 45 minutes Critical care time was exclusive of separately billable procedures and treating other patients. Critical care was necessary to treat or prevent imminent or life-threatening deterioration. Critical care was time spent personally by me on the following activities: development of treatment plan with patient and/or surrogate as well as nursing, discussions with consultants, evaluation of patient's response to treatment, examination of patient, obtaining history from patient or surrogate, ordering and performing treatments and interventions, ordering and review of laboratory studies, ordering and review of radiographic studies, pulse oximetry and re-evaluation of patient's condition. Clinical Course     Patient is obtunded and in critical condition. Differential diagnosis included sepsis, pneumonia, stroke, head bleed. CT head was negative. Chest x-ray shows bilateral opacities consistent with significant pneumonia. I discussed CODE STATUS with her daughter who is a Designer, jewellery. She confirmed that her mother did not want heroics including intubation.  Sepsis protocol was initiated occluding vigorous IV hydration and intravenous antibiotics. Patient continued to do poorly in her emergency department course. Her respirations became more shallow and she became bradycardic. Ultimately, she she went into asystole. Death was declared at 10:30  Final Clinical Impressions(s) / ED Diagnoses   Final diagnoses:  Community acquired pneumonia, unspecified laterality    New Prescriptions New Prescriptions   No medications on file   I personally performed the services described in this documentation, which was scribed in my presence. The recorded information has been reviewed and is accurate.      Donnetta Hutching, MD 03-10-16 [id number]

## 2016-03-22 NOTE — ED Notes (Signed)
CRITICAL VALUE ALERT  Critical value received:  Trop I stat 9.12 Date of notification:  March 02, 2016 Time of notification:  0856  Critical value read back:Yes.    Nurse who received alert:  [facility] RN  MD notified (1st page):  Dr. Adriana Simas  Time of first page:  [phone]  MD notified (2nd page):  Time of second page:  Responding MD:  Dr. Adriana Simas  Time MD responded:  [phone]

## 2016-03-22 NOTE — ED Notes (Signed)
CRITICAL VALUE ALERT  Critical value received:  istat lactic 3.69  Date of notification:  03/03/16  Time of notification:  0846  Critical value read back:Yes.    Nurse who received alert:  [facility] RN  MD notified (1st page):  Dr. Adriana Simas  Time of first page:  [phone]  MD notified (2nd page):  Time of second page:  Responding MD:  Dr. Adriana Simas  Time MD responded:  [phone]

## 2016-03-22 NOTE — ED Notes (Signed)
CRITICAL VALUE ALERT  Critical value received:  Troponin (lab) 9.12  Date of notification:  03-06-2016  Time of notification:  0914  Critical value read back:Yes.    Nurse who received alert:  [facility] RN  MD notified (1st page):  Dr. Adriana Simas  Time of first page:  0914  MD notified (2nd page):  Time of second page:  Responding MD:  Dr. Adriana Simas  Time MD responded:  [phone]

## 2016-03-22 NOTE — ED Notes (Signed)
[facility] states patient is not a organ donation candidate due to her age.

## 2016-03-22 NOTE — ED Notes (Addendum)
Dr. Adriana Simas notified that patient's O2 saturation is steadily decreasing to 78% despite being on nonrebreather @ 15lpm  - states he will talk to family regarding goals of care. Patient is a DNR. Family remains at bedside.

## 2016-03-22 NOTE — ED Notes (Signed)
Lab at bedside obtaining second blood culture.

## 2016-03-22 DEATH — deceased

## 2017-11-02 IMAGING — CR DG CHEST 1V PORT
1 series · 1 of 1 positions shown · non-contrast
Comparison: Chest radiographs 05/31/2009.

CLINICAL DATA: 75-year-old female with cough for 3 weeks, found by
family febrile and lethargic with labored breathing. Respiratory
distress. Initial encounter.

EXAM:
PORTABLE CHEST 1 VIEW

[ap portable]
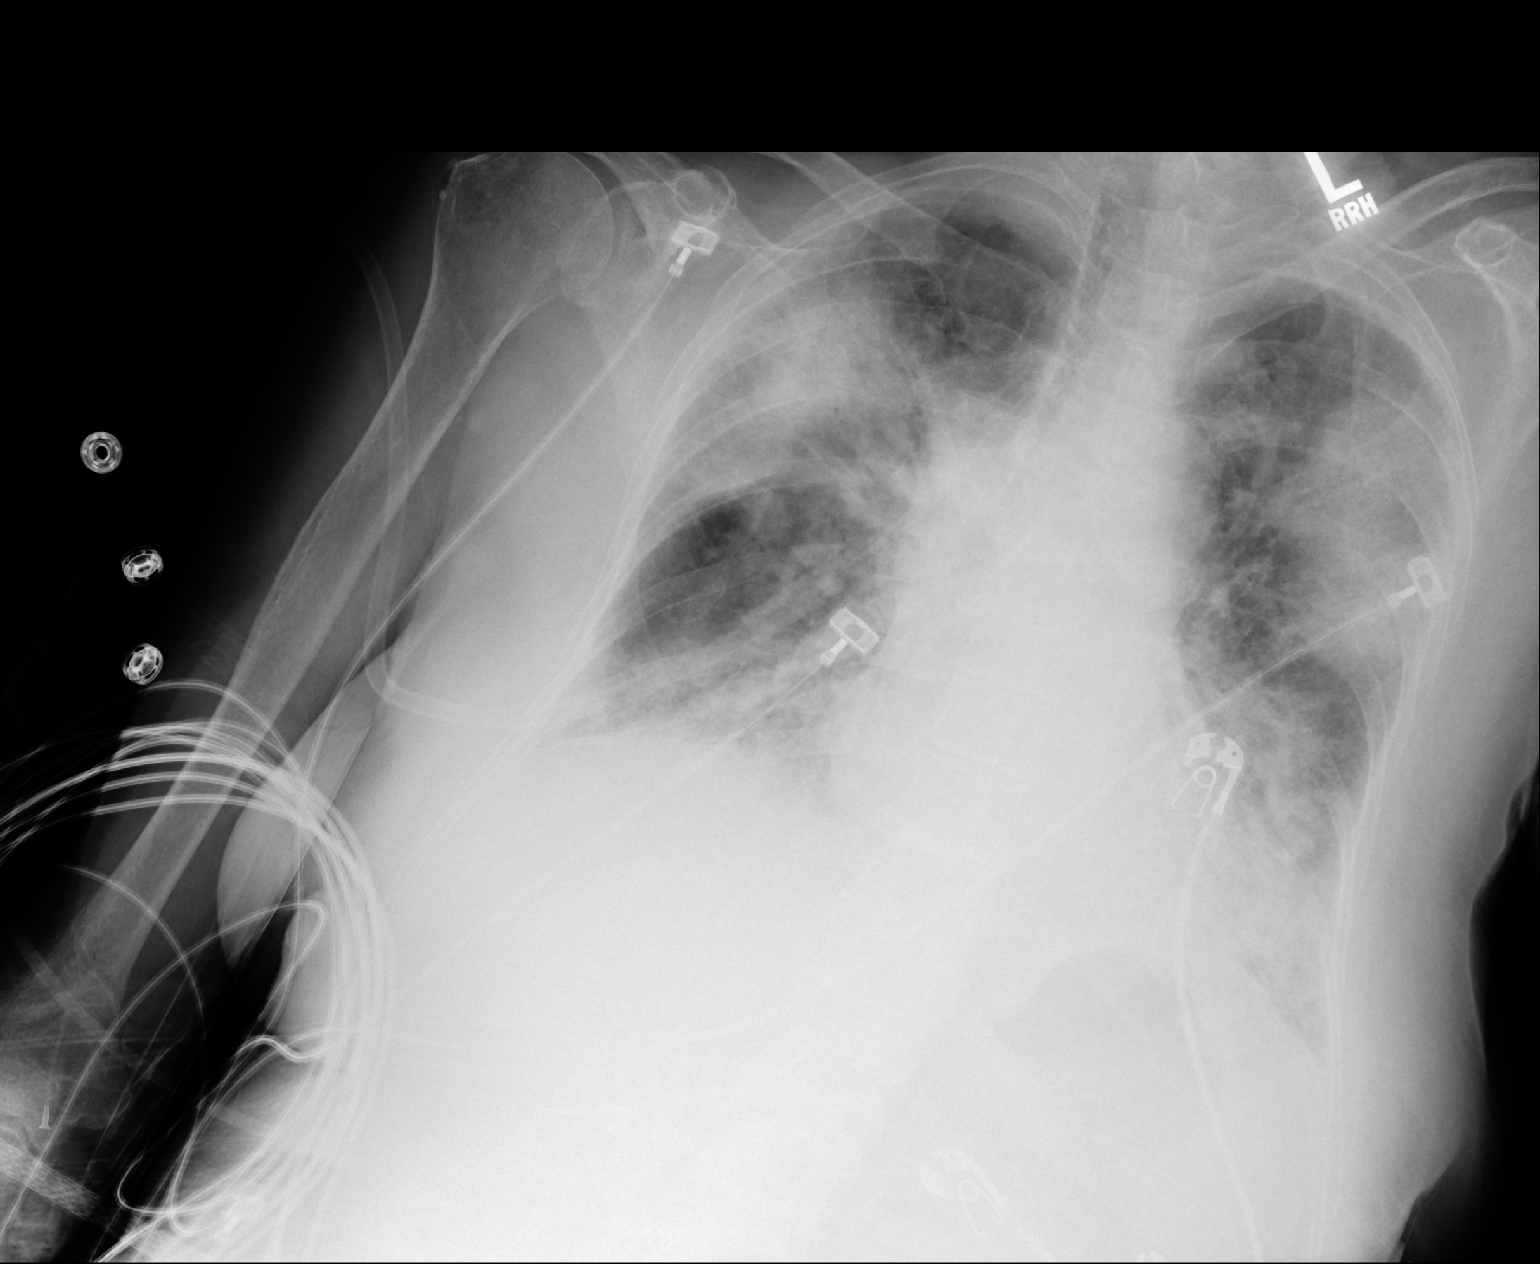

[1 of 1 positions shown; findings below may reference images not displayed]

FINDINGS: Portable AP upright view at 4145 hours. Confluent bilateral upper
lobe and left greater than right lower lobe airspace opacity is new
since 5522. Lung volumes appear stable. Mediastinal contours appear
stable and within normal limits. Visualized tracheal air column is
within normal limits. No pneumothorax or pleural effusion
identified. Stable cholecystectomy clips. No acute osseous
abnormality identified.
IMPRESSION: Confluent bilateral pulmonary opacity most suspicious for bilateral
multilobar pneumonia in this clinical setting. Consider also
aspiration. No pleural effusion identified.

## 2017-11-02 IMAGING — CT CT HEAD W/O CM
3 of 6 series · 16 of 47 positions shown, 19 images · non-contrast
Comparison: None.

CLINICAL DATA: Altered mental status

EXAM:
CT HEAD WITHOUT CONTRAST
TECHNIQUE: Contiguous axial images were obtained from the base of the skull
through the vertex without intravenous contrast.

[Series 2: head trauma wo · axial · 0.41mm/px · z∈[+49,+179]mm · 12 of 30 slices shown, 15 images]
[im 2/30  brain]
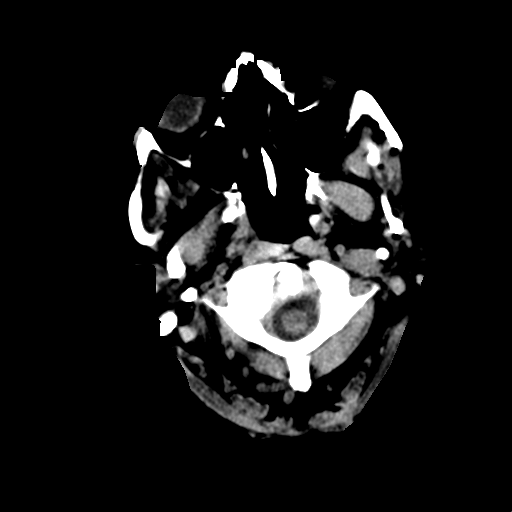
[im 2/30  bone]
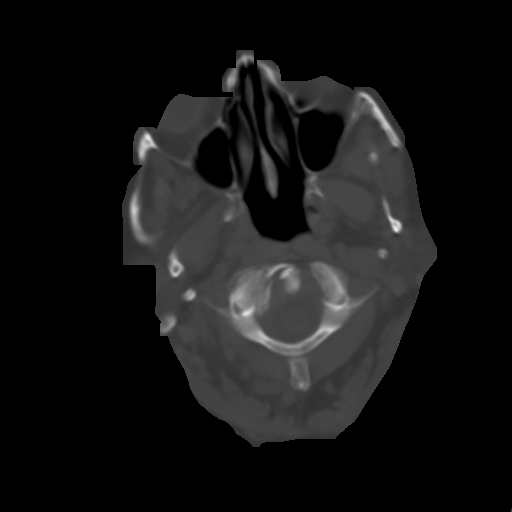
[im 5/30  brain]
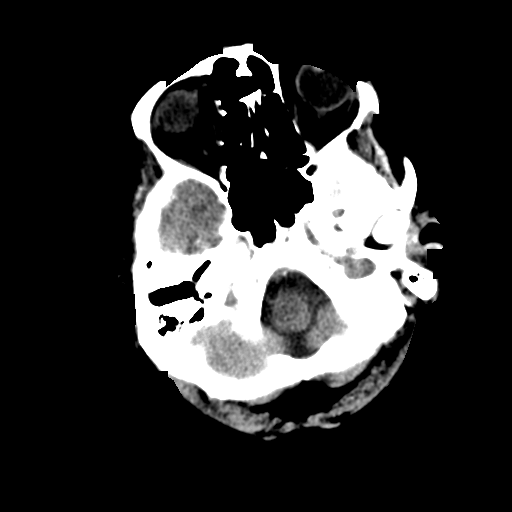
[im 7/30  brain]
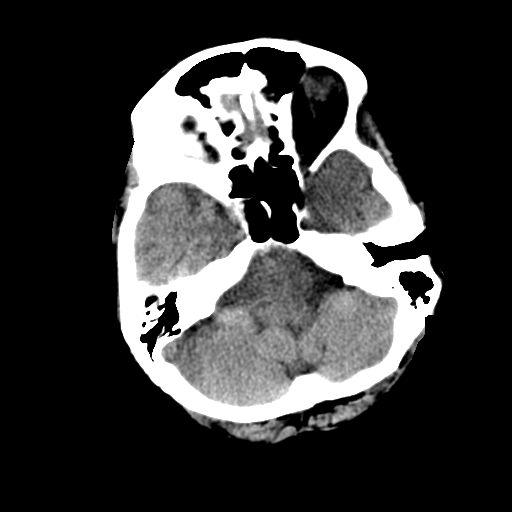
[im 9/30  brain]
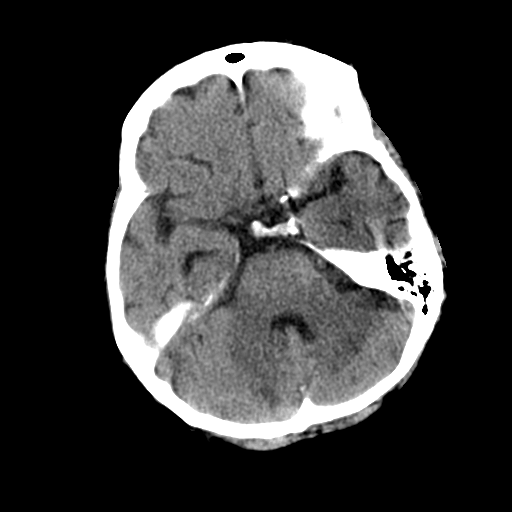
[im 12/30  brain]
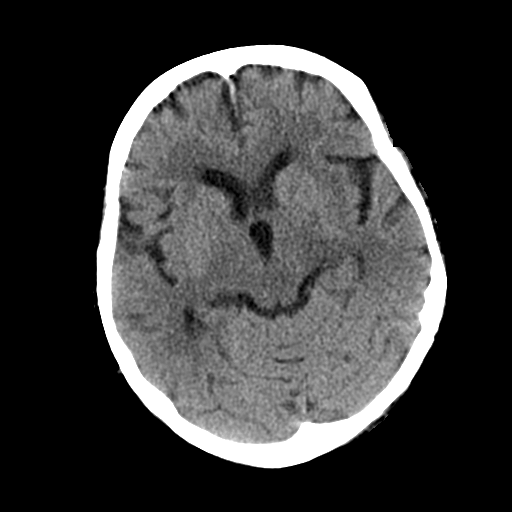
[im 12/30  bone]
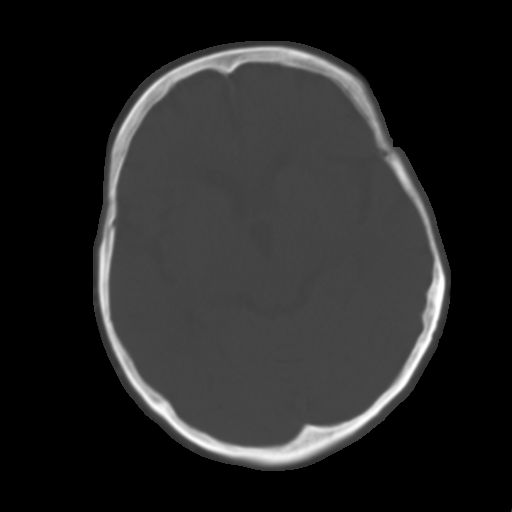
[im 14/30  brain]
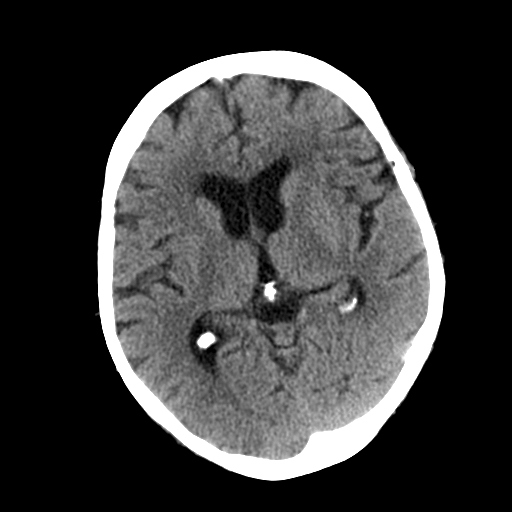
[im 16/30  brain]
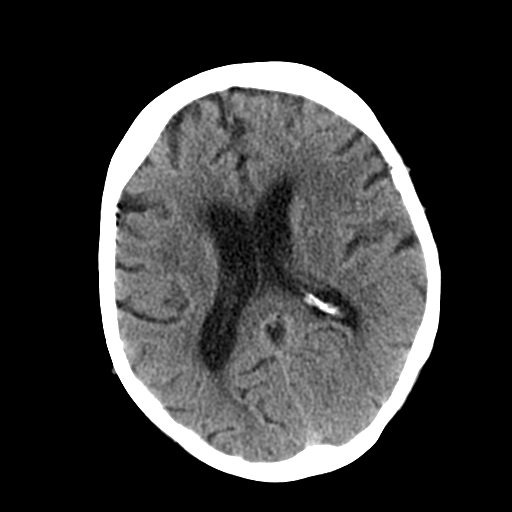
[im 18/30  brain]
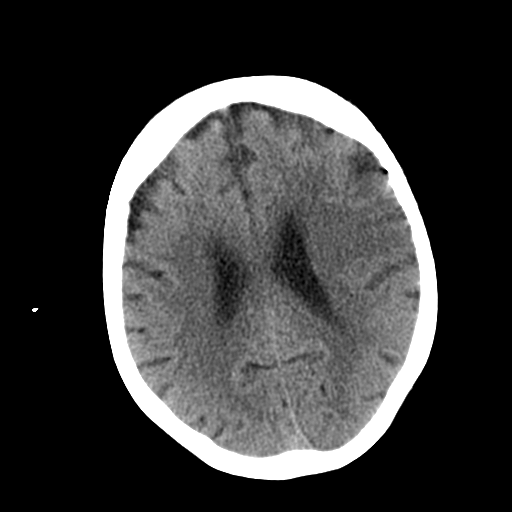
[im 21/30  brain]
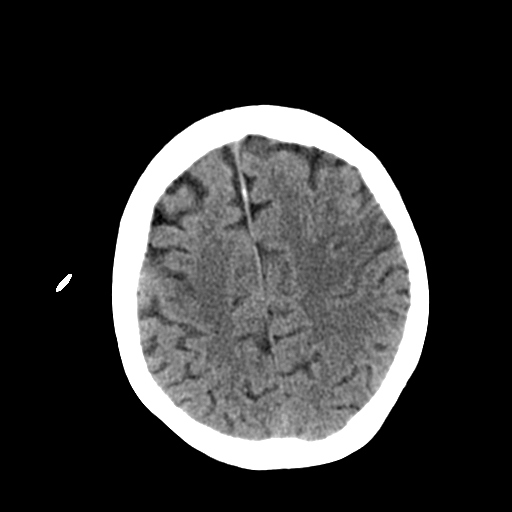
[im 21/30  bone]
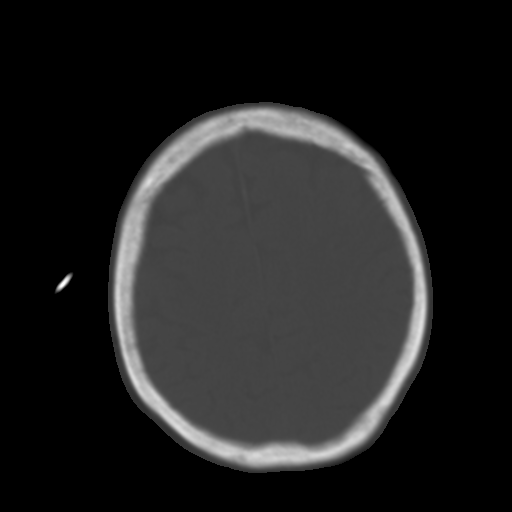
[im 23/30  brain]
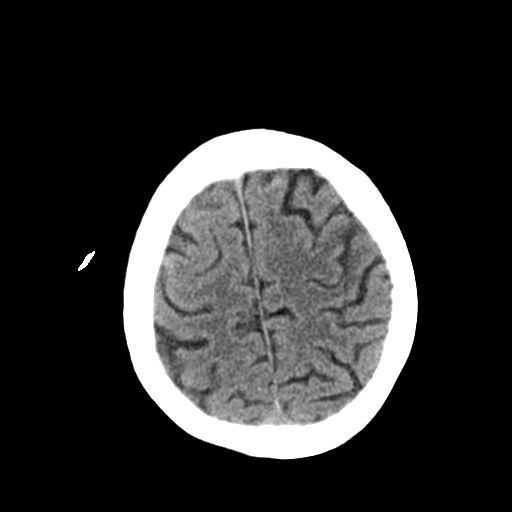
[im 25/30  brain]
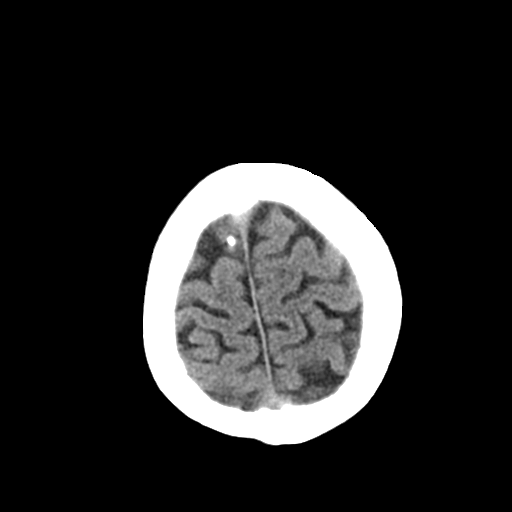
[im 28/30  brain]
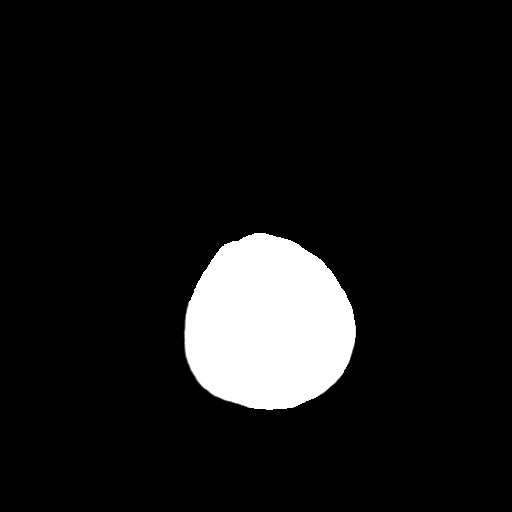

[Series 4: coronal soft tissue · coronal · 0.31mm/px · 3 of 81 slices shown]
[im 21/81  brain]
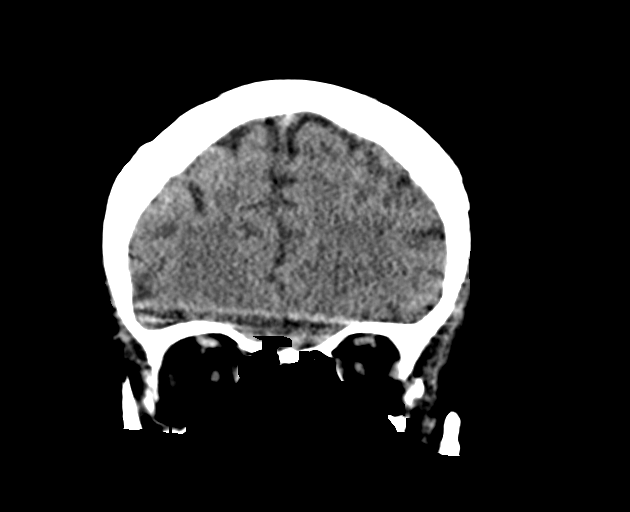
[im 41/81  brain]
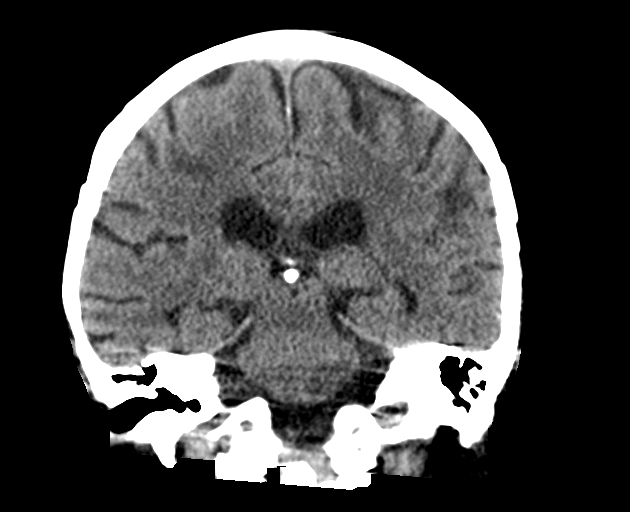
[im 61/81  brain]
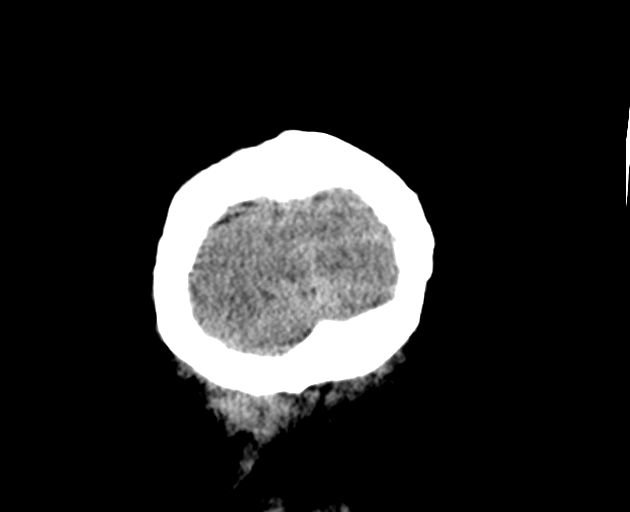

[Series 5: sagittal soft tissue · sagittal · 0.34mm/px · 1 of 57 slices shown]
[im 29/57  brain]
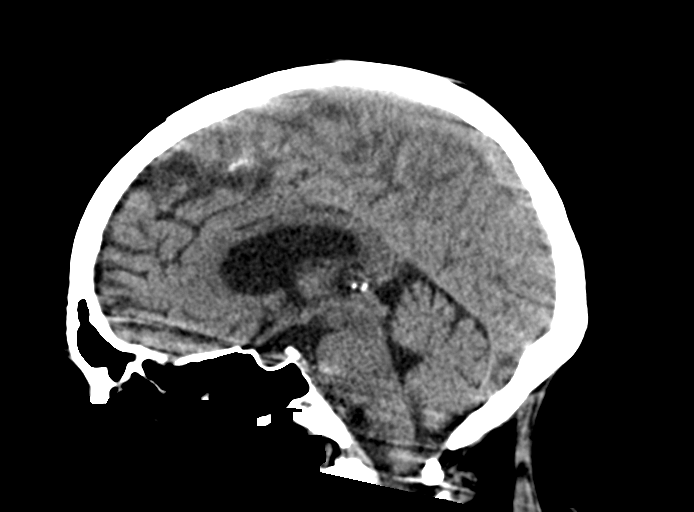

[16 of 47 positions shown; findings below may reference images not displayed]

FINDINGS: Brain: No acute intracranial abnormality. Specifically, no
hemorrhage, hydrocephalus, mass lesion, acute infarction, or
significant intracranial injury.

Vascular: No hyperdense vessel or unexpected calcification.

Skull: No acute calvarial abnormality.

Sinuses/Orbits: Visualized paranasal sinuses and mastoids clear.
Orbital soft tissues unremarkable.

Other: None
IMPRESSION: No acute intracranial abnormality.
